# Patient Record
Sex: Female | Born: 1960
Health system: Southern US, Community
[De-identification: ages and names within clinical notes are randomized; demographics above are authoritative.]

## PROBLEM LIST (undated history)

## (undated) DIAGNOSIS — R232 Flushing: Secondary | ICD-10-CM

## (undated) DIAGNOSIS — R5383 Other fatigue: Secondary | ICD-10-CM

## (undated) DIAGNOSIS — R6882 Decreased libido: Secondary | ICD-10-CM

## (undated) DIAGNOSIS — F32A Depression, unspecified: Secondary | ICD-10-CM

## (undated) DIAGNOSIS — Z86010 Personal history of colonic polyps: Secondary | ICD-10-CM

## (undated) DIAGNOSIS — I10 Essential (primary) hypertension: Secondary | ICD-10-CM

## (undated) DIAGNOSIS — F329 Major depressive disorder, single episode, unspecified: Secondary | ICD-10-CM

## (undated) DIAGNOSIS — K760 Fatty (change of) liver, not elsewhere classified: Secondary | ICD-10-CM

## (undated) DIAGNOSIS — M549 Dorsalgia, unspecified: Secondary | ICD-10-CM

## (undated) DIAGNOSIS — R7303 Prediabetes: Secondary | ICD-10-CM

## (undated) DIAGNOSIS — R600 Localized edema: Secondary | ICD-10-CM

## (undated) DIAGNOSIS — N764 Abscess of vulva: Secondary | ICD-10-CM

## (undated) DIAGNOSIS — G4730 Sleep apnea, unspecified: Secondary | ICD-10-CM

## (undated) DIAGNOSIS — R079 Chest pain, unspecified: Secondary | ICD-10-CM

## (undated) DIAGNOSIS — R0600 Dyspnea, unspecified: Secondary | ICD-10-CM

## (undated) DIAGNOSIS — M2550 Pain in unspecified joint: Secondary | ICD-10-CM

## (undated) HISTORY — DX: Personal history of colonic polyps: Z86.010

## (undated) HISTORY — DX: Fatty (change of) liver, not elsewhere classified: K76.0

## (undated) HISTORY — PX: TONSILLECTOMY: SHX5217

## (undated) HISTORY — DX: Other fatigue: R53.83

## (undated) HISTORY — DX: Dorsalgia, unspecified: M54.9

## (undated) HISTORY — DX: Abscess of vulva: N76.4

## (undated) HISTORY — PX: WISDOM TOOTH EXTRACTION: SHX21

## (undated) HISTORY — DX: Depression, unspecified: F32.A

## (undated) HISTORY — DX: Sleep apnea, unspecified: G47.30

## (undated) HISTORY — DX: Prediabetes: R73.03

## (undated) HISTORY — DX: Decreased libido: R68.82

## (undated) HISTORY — DX: Pain in unspecified joint: M25.50

## (undated) HISTORY — DX: Localized edema: R60.0

## (undated) HISTORY — DX: Dyspnea, unspecified: R06.00

## (undated) HISTORY — DX: Essential (primary) hypertension: I10

## (undated) HISTORY — DX: Major depressive disorder, single episode, unspecified: F32.9

## (undated) HISTORY — DX: Flushing: R23.2

## (undated) HISTORY — DX: Chest pain, unspecified: R07.9

---

## 1998-08-29 ENCOUNTER — Other Ambulatory Visit: Admission: RE | Admit: 1998-08-29 | Discharge: 1998-08-29 | Payer: Self-pay | Admitting: Obstetrics and Gynecology

## 1998-09-14 ENCOUNTER — Encounter: Payer: Self-pay | Admitting: Obstetrics and Gynecology

## 1998-09-14 ENCOUNTER — Ambulatory Visit (HOSPITAL_COMMUNITY): Admission: RE | Admit: 1998-09-14 | Discharge: 1998-09-14 | Payer: Self-pay | Admitting: Obstetrics & Gynecology

## 2000-12-24 ENCOUNTER — Other Ambulatory Visit: Admission: RE | Admit: 2000-12-24 | Discharge: 2000-12-24 | Payer: Self-pay | Admitting: Obstetrics and Gynecology

## 2001-01-19 ENCOUNTER — Encounter: Payer: Self-pay | Admitting: Obstetrics and Gynecology

## 2001-01-19 ENCOUNTER — Ambulatory Visit (HOSPITAL_COMMUNITY): Admission: RE | Admit: 2001-01-19 | Discharge: 2001-01-19 | Payer: Self-pay | Admitting: Obstetrics and Gynecology

## 2001-12-23 ENCOUNTER — Other Ambulatory Visit: Admission: RE | Admit: 2001-12-23 | Discharge: 2001-12-23 | Payer: Self-pay | Admitting: Obstetrics and Gynecology

## 2002-02-09 ENCOUNTER — Encounter: Payer: Self-pay | Admitting: Obstetrics and Gynecology

## 2002-02-09 ENCOUNTER — Ambulatory Visit (HOSPITAL_COMMUNITY): Admission: RE | Admit: 2002-02-09 | Discharge: 2002-02-09 | Payer: Self-pay | Admitting: Obstetrics and Gynecology

## 2009-05-19 LAB — CONVERTED CEMR LAB: Pap Smear: NORMAL

## 2009-05-19 LAB — HM MAMMOGRAPHY: HM Mammogram: NORMAL

## 2009-05-25 ENCOUNTER — Encounter: Admission: RE | Admit: 2009-05-25 | Discharge: 2009-05-25 | Payer: Self-pay | Admitting: Obstetrics and Gynecology

## 2009-07-26 ENCOUNTER — Ambulatory Visit: Payer: Self-pay | Admitting: Internal Medicine

## 2009-07-26 DIAGNOSIS — R0602 Shortness of breath: Secondary | ICD-10-CM | POA: Insufficient documentation

## 2009-07-26 DIAGNOSIS — R062 Wheezing: Secondary | ICD-10-CM | POA: Insufficient documentation

## 2009-07-26 DIAGNOSIS — F32A Depression, unspecified: Secondary | ICD-10-CM | POA: Insufficient documentation

## 2009-07-26 DIAGNOSIS — F329 Major depressive disorder, single episode, unspecified: Secondary | ICD-10-CM

## 2009-07-26 DIAGNOSIS — Z87891 Personal history of nicotine dependence: Secondary | ICD-10-CM | POA: Insufficient documentation

## 2009-07-28 LAB — CONVERTED CEMR LAB
ALT: 28 units/L (ref 0–35)
AST: 29 units/L (ref 0–37)
Albumin: 4.7 g/dL (ref 3.5–5.2)
Alkaline Phosphatase: 55 units/L (ref 39–117)
BUN: 13 mg/dL (ref 6–23)
Basophils Absolute: 0.1 10*3/uL (ref 0.0–0.1)
Basophils Relative: 1 % (ref 0.0–3.0)
Bilirubin, Direct: 0.1 mg/dL (ref 0.0–0.3)
CO2: 30 meq/L (ref 19–32)
Calcium: 10 mg/dL (ref 8.4–10.5)
Chloride: 104 meq/L (ref 96–112)
Cholesterol: 251 mg/dL — ABNORMAL HIGH (ref 0–200)
Creatinine, Ser: 0.7 mg/dL (ref 0.4–1.2)
Direct LDL: 163.5 mg/dL
Eosinophils Absolute: 0.7 10*3/uL (ref 0.0–0.7)
Eosinophils Relative: 5.8 % — ABNORMAL HIGH (ref 0.0–5.0)
GFR calc non Af Amer: 100.7 mL/min (ref >60)
Glucose, Bld: 85 mg/dL (ref 70–99)
HCT: 42.5 % (ref 36.0–46.0)
HDL: 52.4 mg/dL (ref >39.00)
Hemoglobin: 14.7 g/dL (ref 12.0–15.0)
Lymphocytes Relative: 29 % (ref 12.0–46.0)
Lymphs Abs: 3.3 10*3/uL (ref 0.7–4.0)
MCHC: 34.6 g/dL (ref 30.0–36.0)
MCV: 88.1 fL (ref 78.0–100.0)
Monocytes Absolute: 0.8 10*3/uL (ref 0.1–1.0)
Monocytes Relative: 7.4 % (ref 3.0–12.0)
Neutro Abs: 6.5 10*3/uL (ref 1.4–7.7)
Neutrophils Relative %: 56.8 % (ref 43.0–77.0)
Platelets: 324 10*3/uL (ref 150.0–400.0)
Potassium: 5.2 meq/L — ABNORMAL HIGH (ref 3.5–5.1)
RBC: 4.82 M/uL (ref 3.87–5.11)
RDW: 12.5 % (ref 11.5–14.6)
Sodium: 145 meq/L (ref 135–145)
TSH: 1.3 microintl units/mL (ref 0.35–5.50)
Total Bilirubin: 0.8 mg/dL (ref 0.3–1.2)
Total CHOL/HDL Ratio: 5
Total Protein: 7.7 g/dL (ref 6.0–8.3)
Triglycerides: 206 mg/dL — ABNORMAL HIGH (ref 0.0–149.0)
VLDL: 41.2 mg/dL — ABNORMAL HIGH (ref 0.0–40.0)
WBC: 11.4 10*3/uL — ABNORMAL HIGH (ref 4.5–10.5)

## 2009-08-10 ENCOUNTER — Telehealth: Payer: Self-pay | Admitting: *Deleted

## 2009-08-24 ENCOUNTER — Telehealth (INDEPENDENT_AMBULATORY_CARE_PROVIDER_SITE_OTHER): Payer: Self-pay | Admitting: *Deleted

## 2009-08-30 ENCOUNTER — Ambulatory Visit: Payer: Self-pay | Admitting: Internal Medicine

## 2009-08-31 ENCOUNTER — Telehealth: Payer: Self-pay | Admitting: *Deleted

## 2009-08-31 ENCOUNTER — Encounter: Payer: Self-pay | Admitting: Internal Medicine

## 2009-10-18 ENCOUNTER — Ambulatory Visit: Payer: Self-pay | Admitting: Internal Medicine

## 2009-10-18 DIAGNOSIS — N951 Menopausal and female climacteric states: Secondary | ICD-10-CM | POA: Insufficient documentation

## 2009-10-26 ENCOUNTER — Encounter (INDEPENDENT_AMBULATORY_CARE_PROVIDER_SITE_OTHER): Payer: Self-pay | Admitting: *Deleted

## 2009-11-10 ENCOUNTER — Telehealth: Payer: Self-pay | Admitting: Internal Medicine

## 2009-11-30 ENCOUNTER — Ambulatory Visit: Payer: Self-pay | Admitting: Internal Medicine

## 2009-12-07 ENCOUNTER — Encounter: Payer: Self-pay | Admitting: Internal Medicine

## 2010-02-22 ENCOUNTER — Telehealth: Payer: Self-pay | Admitting: *Deleted

## 2010-03-20 NOTE — Assessment & Plan Note (Signed)
Summary: fup pft//ccm   Vital Signs:  Patient profile:   50 year old female Menstrual status:  regular LMP:     10/04/2009 Weight:      192 pounds O2 Sat:      98 % on Room air Pulse rate:   83 / minute BP sitting:   110 / 70  (left arm) Cuff size:   regular  Vitals Entered By: Romualdo Bolk, CMA (AAMA) (October 18, 2009 11:44 AM)  O2 Flow:  Room air  Contraindications/Deferment of Procedures/Staging:    Test/Procedure: FLU VAX    Reason for deferment: patient declined  CC: Follow-up visit on PFT's and discuss going on antidpressant-Pt states that she feels very high strong at work and has trouble controlling her temperment., Depression LMP (date): 10/04/2009 LMP - Character: normal Menarche (age onset years): 12   Menses interval (days): 28 Menstrual flow (days): 5-7 Enter LMP: 10/04/2009 Last PAP Result normal   History of Present Illness: Desiree Cooper  comes in today  for follow up of   medical problems.  RESP: much bettter on the symbicort  Had prfts .Marland Kitchen ? need for follow up  Prevention: hasnt t gone to  colonosocopy yet/ Mood issues:  externally  sressed and hard to concentrate and now she thinks irs  affecting her work.      off an on for about a year. and worse for 3-4 months . NO hx of depression  rx or strong family hx  of such. No add or ld dx. lots of energy to  get throught her work day.    anxious and sad with this.   and sometimes short with people.  Not suicidal.   She is menopausal and some of above felt tot be aggravating above .  Depression History:      The patient denies a depressed mood most of the day but notes a diminished interest in her usual daily activities.  Positive alarm features for depression include significant weight gain, insomnia, fatigue (loss of energy), and impaired concentration (indecisiveness).  However, she denies significant weight loss, hypersomnia, psychomotor agitation, psychomotor retardation, feelings of worthlessness  (guilt), and recurrent thoughts of death or suicide.  Positive alarm features for a manic disorder include persistently & abnormally elevated mood, abnormally & persistently irritable mood, distractibility, and flight of ideas.  She denies less need for sleep, talkative or feels need to keep talking, increase in goal-directed activity, psychomotor agitation, inflated self-esteem or grandiosity, excessive buying sprees, excessive sexual indiscretions, and excessive foolish business investments.        Risk factors for depression include a family history of depression, a family history of alcoholism, a personal history of depression, and a personal history of alcoholism.  The patient denies that she feels like life is not worth living, denies that she wishes that she were dead, and denies that she has thought about ending her life.  Due to her current symptoms, it often takes extra effort to do the things she needs to do.         Preventive Screening-Counseling & Management  Alcohol-Tobacco     Alcohol drinks/day: <1     Alcohol type: wine     Smoking Status: quit     Year Quit: 2005     Passive Smoke Exposure: no  Caffeine-Diet-Exercise     Caffeine use/day: 3     Does Patient Exercise: yes  Current Medications (verified): 1)  Ventolin Hfa 108 (90 Base) Mcg/act Aers (Albuterol  Sulfate) 2)  Natural Herbs- Premenopausal 3)  Multivitamins   Tabs (Multiple Vitamin) 4)  Fish Oil   Oil (Fish Oil) 5)  Symbicort 160-4.5 Mcg/act Aero (Budesonide-Formoterol Fumarate) .... 2 Puffs Two Times A Day  Allergies (verified): 1)  ! Penicillin  Past History:  Past medical, surgical, family and social histories (including risk factors) reviewed, and no changes noted (except as noted below).  Past Medical History: Reviewed history from 07/26/2009 and no changes required. G1P1   1988 ? asthma   inhaler use for 4 years  Depression Joint pains   Past Surgical History: Reviewed history from 07/26/2009  and no changes required. Tonsillectomy  Past History:  Care Management: Gynecology: Stefano Gaul  Family History: Reviewed history from 07/26/2009 and no changes required. Father: Deceased age 48-Alcholism Mother: COPD, arthritis     depression Siblings: Only Child no hx of clotting   Social History: Reviewed history from 07/26/2009 and no changes required. 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  The patient denies anorexia, fever, weight loss, vision loss, prolonged cough, muscle weakness, difficulty walking, abnormal bleeding, enlarged lymph nodes, and angioedema.    Physical Exam  General:  alert, well-developed, well-nourished, and well-hydrated.   Head:  normocephalic and atraumatic.   Neck:  No deformities, masses, or tenderness noted. Lungs:  normal respiratory effort, no intercostal retractions, and no accessory muscle use.  see pFTS reviewed  Heart:  normal rate, regular rhythm, no murmur, and no gallop.   Psych:  Oriented X3, good eye contact, and not anxious appearing.  appears stressesd and somehat subdued .   nl cognition and speech  for situations   Impression & Recommendations:  Problem # 1:  DYSPNEA (ICD-786.05) Assessment Improved nl pfts but felet better witn inhalers .    still rec  eval   opnion   for future managment and assessment of signs .  she agress   Problem # 2:  MENOPAUSE-RELATED VASOMOTOR SYMPTOMS, HOT FLASHES (ICD-627.2) problematic  affecting mood and concentration  Problem # 3:  OBESITY (ICD-278.00) agree with weight loss Ht: 63 (07/26/2009)   Wt: 192 (10/18/2009)   BMI: 33.25 (07/26/2009)  Problem # 4:  DEPRESSION (ICD-311) multiple factors and agree meds may help with other  strategies .  Discussed risk benefit     optinos  discussed  Her updated medication list for this problem includes:    Fluoxetine Hcl 20 Mg Tabs (Fluoxetine hcl) .Marland Kitchen... Take 1/2 by mouth once daily and then increase to 1 by mouth once daily after a week  Complete Medication List: 1)  Ventolin Hfa 108 (90 Base) Mcg/act Aers (Albuterol sulfate) 2)  Natural Herbs- Premenopausal  3)  Multivitamins Tabs (Multiple vitamin) 4)  Fish Oil Oil (Fish oil) 5)  Symbicort 160-4.5 Mcg/act Aero (Budesonide-formoterol fumarate) .... 2 puffs two times a day 6)  Prometrium 100 Mg Caps (Progesterone micronized) .... ? once a night    per gyne 7)  Fluoxetine Hcl 20 Mg Tabs (Fluoxetine hcl) .... Take 1/2 by mouth once daily and then increase to 1 by mouth once daily after a week  Patient Instructions: 1)  take med as directed   2)  ROV   in 3-4 weeks  call as  needed  3)  keep pulmonary appt for reasons discussed  Prescriptions: FLUOXETINE HCL 20 MG TABS (FLUOXETINE HCL) take 1/2 by mouth once daily and then increase to 1 by mouth once daily after a week  #30 x 1   Entered and Authorized by:   Madelin Headings MD   Signed by:   Madelin Headings MD on 10/18/2009   Method used:   Print then Give to Patient   RxID:   [id number] FLUOXETINE HCL 20 MG TABS (FLUOXETINE HCL) take 1/2 by mouth once daily and then increase to 1 by mouth once daily after a week  #30 x 1   Entered and Authorized by:   Madelin Headings MD   Signed by:   Madelin Headings MD on 10/18/2009   Method used:   [facility] to        [facility] [phone][facility] (retail)       [address]       [place], [place]  [address]       Ph: [phone] or [phone]       Fax: [phone]   RxID:   [id number]

## 2010-03-20 NOTE — Progress Notes (Signed)
Summary: PROCEDURE CODES  Phone Note From Other Clinic   Caller: susan w/ [employer] Call For: triage Summary of Call: needs procedure codes for the PFT scheduled for pt next week (7/13). note:looks like dr Fabian Sharp ordered this. susan at [facility] # [phone] Initial call taken by: Tivis Ringer, CNA,  August 24, 2009 3:57 PM  Follow-up for Phone Call        This test was ordered by Dr. Fabian Sharp.  This is not our pt.  I left VM for Darl Pikes to be aware of this and advised that she contact Dr. Fabian Sharp for this.  Follow-up by: Vernie Murders,  August 24, 2009 4:07 PM

## 2010-03-20 NOTE — Letter (Signed)
Summary: PATIENT HX FORMS  PATIENT HX FORMS   Imported By: Georgian Co 12/07/2009 15:26:05  _____________________________________________________________________  External Attachment:    Type:   Image     Comment:   External Document

## 2010-03-20 NOTE — Progress Notes (Signed)
Summary: refill on symbicort to [facility] Note From Pharmacy   Caller: [facility] Reason for Call: Needs renewal Details for Reason: Symbicort Summary of Call: Pt has never had any rx's sent to [facility] before. Before we can send this to [facility] we need to verify that pt wants this rx sent to [facility]. I have left pt a message to call us back to let us know that she does want [place] to send this rx to [facility]. Initial call taken by: Romualdo Bolk, CMA (AAMA),  August 31, 2009 11:30 AM  Follow-up for Phone Call        Pt called back saying that she does want rx sent to [facility]. Rx sent to [facility] Follow-up by: Romualdo Bolk, CMA (AAMA),  August 31, 2009 12:00 PM    Prescriptions: SYMBICORT 160-4.5 MCG/ACT AERO (BUDESONIDE-FORMOTEROL FUMARATE) 2 puffs two times a day  #3 x 3   Entered by:   Romualdo Bolk, CMA (AAMA)   Authorized by:   Madelin Headings MD   Signed by:   Romualdo Bolk, CMA (AAMA) on 08/31/2009   Method used:   Electronically to        [facility]* (retail)             ,          Ph: [phone]       Fax: [phone]   RxID:   [id number]

## 2010-03-20 NOTE — Assessment & Plan Note (Signed)
Summary: BRAND NEW PT/TO EST/CJR   Vital Signs:  Patient profile:   50 year old female Menstrual status:  regular LMP:     07/04/2009 Height:      63 inches Weight:      187 pounds BMI:     33.25 Pulse rate:   72 / minute BP sitting:   120 / 80  (left arm) Cuff size:   regular  Vitals Entered By: Romualdo Bolk, CMA (AAMA) (July 26, 2009 2:01 PM)  Nutrition Counseling: Patient's BMI is greater than 25 and therefore counseled on weight management options. CC: New Pt to establish- Discuss breathing problems Asthma/ Bronchitis LMP (date): 07/04/2009 LMP - Character: normal Menarche (age onset years): 12   Menses interval (days): 28 Menstrual flow (days): 5-7 Menstrual Status regular Enter LMP: 07/04/2009 Last PAP Result normal   History of Present Illness: Kalima Dill comes in today   for new patient visit.  No PCP in the area and had moved   from [facility].  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  Major problem has been respiratory.   seems from moving to [place] area . ( over a year )   Using inhaler q d . and pre exercise.   Worsening  [place]    IF  lays on right side wheezes in her upper chest .   Last chest x ray  was ok   4 years ago.  has been seen in Urgent care several times for resp  problems in the past and   treated .        no hx of seasonal allergies   .  Remote hx of tobacco stopped 5 years ago and  chronic cough that she had  went away.    No ets  or other aggravators seen.   Preventive Care Screening  Mammogram:    Date:  05/19/2009    Results:  normal   Pap Smear:    Date:  05/19/2009    Results:  normal    Preventive Screening-Counseling & Management  Alcohol-Tobacco     Alcohol drinks/day: <1     Alcohol type: wine     Smoking Status: quit     Year Quit: 2005     Passive Smoke Exposure: no  Caffeine-Diet-Exercise     Caffeine use/day: 3     Does Patient  Exercise: yes  Comments: 1/2 ppd   for 20 years ,     no ets.   Safety-Violence-Falls     Seat Belt Use: yes     Firearms in the Home: no firearms in the home     Smoke Detectors: yes      Drug Use:  no.    Current Medications (verified): 1)  Ventolin Hfa 108 (90 Base) Mcg/act Aers (Albuterol Sulfate) 2)  Natural Herbs- Premenopausal 3)  Multivitamins   Tabs (Multiple Vitamin) 4)  Fish Oil   Oil (Fish Oil)  Allergies (verified): 1)  ! Penicillin  Past History:  Past Medical History: G1P1   1988 ? 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         had lost weight and then gained back   weigh tloss helped breathing . snores  ? no OSA  Physical Exam  General:  alert, well-developed, well-nourished, and well-hydrated.    does no cough but some throat clearing during exam  Head:  normocephalic and atraumatic.   Eyes:  PERRL, EOMs full, conjunctiva clear  Ears:  R ear normal, L ear normal, and no external deformities.   Nose:  no external deformity, no external erythema, and no nasal discharge.  face on  tender Mouth:  pharynx pink and moist.  low sort palate Neck:  No deformities, masses, or tenderness noted. Lungs:  Normal respiratory effort, chest expands symmetrically. Lungs are clear to auscultation, no crackles or wheezes.no dullness.   Heart:  Normal rate and regular rhythm. S1 and S2 normal without gallop, murmur, click, rub or other extra sounds.no lifts.   Abdomen:  Bowel sounds positive,abdomen soft and non-tender without masses, organomegaly or   noted. Msk:  no joint swelling, no joint warmth, and no redness over joints.   Pulses:  pulses intact without delay   Extremities:  no clubbing cyanosis or edema  Neurologic:  alert & oriented X3, strength normal in all extremities, and gait normal.   Skin:  turgor normal, color normal, no ecchymoses, and no petechiae.   Cervical Nodes:  No lymphadenopathy noted Psych:  Oriented X3, normally interactive, good eye contact, not anxious appearing, and not depressed appearing.     Impression & Recommendations:  Problem # 1:  DYSPNEA (ICD-786.05) see below   doubt cardiac cause   hx of tobacco and has obesity  and family hx of copd.   should evaluate.  Orders: T-2 View CXR (71020TC) TLB-TSH (Thyroid Stimulating Hormone) (84443-TSH) TLB-Hepatic/Liver Function Pnl (80076-HEPATIC) TLB-CBC Platelet - w/Differential (85025-CBCD) TLB-BMP (Basic Metabolic Panel-BMET) (80048-METABOL) TLB-Lipid Panel (80061-LIPID) Pulmonary Referral (Pulmonary)  Problem # 2:  WHEEZING (ICD-786.07) ? if asthmatic  related      could have some upper airway component.  better with weight reduction and has clearing of throat  vs cough on observation Orders: T-2 View CXR (71020TC) TLB-TSH (Thyroid Stimulating Hormone) (84443-TSH) TLB-Hepatic/Liver Function Pnl (80076-HEPATIC) TLB-CBC Platelet - w/Differential (85025-CBCD) TLB-BMP (Basic Metabolic Panel-BMET) (80048-METABOL) TLB-Lipid Panel (80061-LIPID) Pulmonary Referral (Pulmonary)  Problem # 3:  OBESITY  (ICD-278.00) agree with weight loss again  Problem # 4:  TOBACCO USE, QUIT (ICD-V15.82) Assessment: Comment Only  Complete Medication List: 1)  Ventolin Hfa 108 (90 Base) Mcg/act Aers (Albuterol sulfate) 2)  Natural Herbs- Premenopausal  3)  Multivitamins Tabs (Multiple vitamin) 4)  Fish Oil Oil (Fish oil) 5)  Symbicort 160-4.5 Mcg/act Aero (Budesonide-formoterol fumarate) .... 2 puffs two times a day  Other Orders: Gastroenterology Referral (GI)  Patient Instructions: 1)  will contact your about colonoscopy screening . 2)  get chest x ray  3)  labs today . 4)  Will contact you about PFTs to be done.  5)  Can try inhaler in the  meantime .  6)  May need to see pulmonary.  7)  Then ROV.  to go over all results    Immunization History:  Pneumovax Immunization History:    Pneumovax:  historical (02/19/2007)

## 2010-03-20 NOTE — Progress Notes (Signed)
Summary: scheduling a colonscopy  Phone Note Outgoing Call Call back at [facility] Phone [phone]   Call placed by: Romualdo Bolk, CMA Duncan Dull),  November 10, 2009 3:07 PM Call placed to: Patient Summary of Call: Calling pt about scheduling a colonscopy. Left message to call back. Initial call taken by: Romualdo Bolk, CMA Duncan Dull),  November 10, 2009 3:07 PM  Follow-up for Phone Call        Left message to call back. Follow-up by: Romualdo Bolk, CMA Duncan Dull),  November 13, 2009 4:43 PM  Additional Follow-up for Phone Call Additional follow up Details #1::        LMTOCB Additional Follow-up by: Romualdo Bolk, CMA Duncan Dull),  November 14, 2009 1:27 PM    Additional Follow-up for Phone Call Additional follow up Details #2::    Spoke to pt she wants to hold off on scheduling the colonscopy for now. Follow-up by: Romualdo Bolk, CMA (AAMA),  November 17, 2009 2:41 PM

## 2010-03-20 NOTE — Progress Notes (Signed)
Summary: Pt req script for Symbicort  Phone Note Refill Request Call back at Home Phone [phone] Message from:  Patient on August 10, 2009 1:14 PM  Refills Requested: Medication #1:  SYMBICORT 160-4.5 MCG/ACT AERO 2 puffs two times a day. Pt was given samples of Symbicort. Req script.    Method Requested: Telephone to [facility] [phone] Initial call taken by: Lucy Antigua,  August 10, 2009 1:14 PM  Follow-up for Phone Call        please refill x 6  Follow-up by: Madelin Headings MD,  August 11, 2009 12:15 PM  Additional Follow-up for Phone Call Additional follow up Details #1::        Rx sent to pharmacy. Additional Follow-up by: Romualdo Bolk, CMA Duncan Dull),  August 11, 2009 12:46 PM    Prescriptions: SYMBICORT 160-4.5 MCG/ACT AERO (BUDESONIDE-FORMOTEROL FUMARATE) 2 puffs two times a day  #1 x 5   Entered by:   Romualdo Bolk, CMA (AAMA)   Authorized by:   Madelin Headings MD   Signed by:   Romualdo Bolk, CMA (AAMA) on 08/11/2009   Method used:   [facility]        [facility] [phone]* (retail)       [address]       [place], [place]  [address]       Ph: [phone] or [phone]       Fax: [phone]   RxID:   [id number]

## 2010-03-20 NOTE — Miscellaneous (Signed)
Summary: Orders Update pft charges  Clinical Lists Changes  Orders: Added new Service order of Carbon Monoxide diffusing w/capacity (94720) - Signed Added new Service order of Lung Volumes (94240) - Signed Added new Service order of Spirometry (Pre & Post) (94060) - Signed 

## 2010-03-20 NOTE — Assessment & Plan Note (Signed)
Summary: follow up on meds/ssc   Vital Signs:  Patient profile:   50 year old female Menstrual status:  regular LMP:     11/04/2009 Weight:      190 pounds Pulse rate:   72 / minute BP sitting:   120 / 80  (right arm) Cuff size:   regular  Vitals Entered By: Romualdo Bolk, CMA Duncan Dull) (November 30, 2009 9:43 AM) CC: Follow-up visit on meds LMP (date): 11/04/2009 LMP - Character: normal Menarche (age onset years): 12   Menses interval (days): 28 Menstrual flow (days): 5-7 Enter LMP: 11/04/2009 Last PAP Result normal   History of Present Illness: Desiree Cooper  come in today for follow up of medication  initiation for depression . She is now on 20 mg prozac and she feels the  med helping mood as far being    more lay back  Others noted at work also.  No se     noted .  Sleep varies.      nose of med   Breathing still ok and using albuterol  weekly.   less and less.  hasnt seen pulmonary follow up yet.   Has high deductable plan and some issues with this.   Preventive Screening-Counseling & Management  Alcohol-Tobacco     Alcohol drinks/day: <1     Alcohol type: wine     Smoking Status: quit     Year Quit: 2005     Passive Smoke Exposure: no  Caffeine-Diet-Exercise     Caffeine use/day: 3     Does Patient Exercise: yes  Current Medications (verified): 1)  Natural Herbs- Premenopausal 2)  Multivitamins   Tabs (Multiple Vitamin) 3)  Fish Oil   Oil (Fish Oil) 4)  Symbicort 160-4.5 Mcg/act Aero (Budesonide-Formoterol Fumarate) .... 2 Puffs Two Times A Day 5)  Prometrium 100 Mg Caps (Progesterone Micronized) .... ? Once A Night    Per Gyne 6)  Fluoxetine Hcl 20 Mg Tabs (Fluoxetine Hcl) .Marland Kitchen.. 1 By Mouth Once Daily  Allergies (verified): 1)  ! Penicillin  Past History:  Past medical, surgical, family and social histories (including risk factors) reviewed for relevance to current acute and chronic problems.  Past Medical History: Reviewed history from 07/26/2009 and  no changes required. G1P1   1988 ? asthma   inhaler use for 4 years  Depression Joint pains   Past Surgical History: Reviewed history from 07/26/2009 and no changes required. Tonsillectomy  Past History:  Care Management: Gynecology: Stefano Gaul  Contraindications/Deferment of Procedures/Staging:    Test/Procedure: FLU VAX    Reason for deferment: patient declined   Family History: Reviewed history from 10/18/2009 and no changes required. Father: Deceased age 72-Alcholism Mother: COPD, arthritis     depression Siblings: Only Child no hx of clotting   Social History: Reviewed history from 10/18/2009 and no changes required. 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    Physical Exam  General:  Well-developed,well-nourished,in no acute distress; alert,appropriate and cooperative throughout examination Head:  Normocephalic and atraumatic without obvious abnormalities. No apparent alopecia or balding. Eyes:  vision grossly intact and pupils equal.   Neck:  No deformities, masses, or tenderness noted. Lungs:  Normal respiratory effort, chest expands symmetrically. Lungs are clear  to auscultation, no crackles or wheezes. Heart:  normal rate, regular rhythm, and no murmur.   Psych:  Oriented X3, normally interactive, good eye contact, not anxious appearing, and not depressed appearing.  some increase motor activity   Impression & Recommendations:  Problem # 1:  DEPRESSION (ICD-311) Assessment Improved continue same  consider increase dose if needed   call for this    has high deductable plan.  situation is stable enough to follow over the phone and in person in six months. Reviewed links of medication management. Her updated medication list for this problem  includes:    Fluoxetine Hcl 20 Mg Tabs (Fluoxetine hcl) .Marland Kitchen... 1 by mouth once daily  Problem # 2:  DYSPNEA (ICD-786.05) Assessment: Improved will get to pulm again soon but doing better   treating as asthma   Problem # 3:  SCREENING, COLON CANCER (ICD-V76.51) she hasnt follow up yet with getting her colonoscopy but she will.  Complete Medication List: 1)  Natural Herbs- Premenopausal  2)  Multivitamins Tabs (Multiple vitamin) 3)  Fish Oil Oil (Fish oil) 4)  Symbicort 160-4.5 Mcg/act Aero (Budesonide-formoterol fumarate) .... 2 puffs two times a day 5)  Prometrium 100 Mg Caps (Progesterone micronized) .... ? once a night    per gyne 6)  Fluoxetine Hcl 20 Mg Tabs (Fluoxetine hcl) .Marland Kitchen.. 1 by mouth once daily  Patient Instructions: 1)  continue same   medication. 2)  call in 2 months  about progress.  3)  then plan  follow up . 4)  but ov or check up in 6 months  Prescriptions: FLUOXETINE HCL 20 MG TABS (FLUOXETINE HCL) 1 by mouth once daily  #90 x 1   Entered and Authorized by:   Madelin Headings MD   Signed by:   Madelin Headings MD on 11/30/2009   Method used:   Print then Give to Patient   RxID:   [id number]

## 2010-03-20 NOTE — Letter (Signed)
Summary: LEC Referral (unable to schedule) Notification  [facility]  [address] [place], [place] [address]   Phone: [phone]  Fax: [phone]      October 26, 2009 Desiree Cooper [birth year] MRN: [id number]   [name] [address] [place], [place]  [address]   Dear [facility]:   Thank you for your kind referral of the above patient. We have attempted to schedule the recommended COLONOSCOPY but have been unable to schedule because:  _X_ The patient was not available by phone and/or has not returned our calls.  __ The patient declined to schedule the procedure at this time.  We appreciate the referral and hope that we will have the opportunity to treat this patient in the future.    Sincerely,   [facility]  Vania Rea. Jarold Motto M.D. Hedwig Morton. Juanda Chance M.D. Venita Lick. Russella Dar M.D. Wilhemina Bonito. Marina Goodell M.D. Barbette Hair. Arlyce Dice M.D. Iva Boop M.D. Cheron Every.D.

## 2010-03-22 NOTE — Progress Notes (Signed)
Summary: Pt req refill of FLUOXETINE HCL 20 MG to [facility] mail order  Phone Note Refill Request Call back at Home Phone [phone] Message from:  Patient on February 22, 2010 4:44 PM  Refills Requested: Medication #1:  FLUOXETINE HCL 20 MG TABS 1 by mouth once daily.   Dosage confirmed as above?Dosage Confirmed   Supply Requested: 3 months Pt is req that this be sent to [facility] order pharmacy. Pls call [facility] [phone]   Method Requested: Telephone to [facility] [phone] Initial call taken by: Lucy Antigua,  February 22, 2010 4:46 PM    Prescriptions: FLUOXETINE HCL 20 MG TABS (FLUOXETINE HCL) 1 by mouth once daily  #90 x 1   Entered by:   Romualdo Bolk, CMA (AAMA)   Authorized by:   Madelin Headings MD   Signed by:   Romualdo Bolk, CMA (AAMA) on 02/23/2010   Method used:   Electronically to        [facility]* (retail)             ,          Ph: [phone]       Fax: [phone]   RxID:   [id number]

## 2010-05-31 ENCOUNTER — Encounter: Payer: Self-pay | Admitting: Internal Medicine

## 2010-06-04 ENCOUNTER — Ambulatory Visit (INDEPENDENT_AMBULATORY_CARE_PROVIDER_SITE_OTHER): Payer: BLUE CROSS/BLUE SHIELD | Admitting: Internal Medicine

## 2010-06-04 ENCOUNTER — Encounter: Payer: Self-pay | Admitting: Internal Medicine

## 2010-06-04 VITALS — BP 120/80 | HR 66 | Wt 197.0 lb

## 2010-06-04 DIAGNOSIS — M79603 Pain in arm, unspecified: Secondary | ICD-10-CM | POA: Insufficient documentation

## 2010-06-04 DIAGNOSIS — M79609 Pain in unspecified limb: Secondary | ICD-10-CM

## 2010-06-04 DIAGNOSIS — R209 Unspecified disturbances of skin sensation: Secondary | ICD-10-CM

## 2010-06-04 DIAGNOSIS — M542 Cervicalgia: Secondary | ICD-10-CM | POA: Insufficient documentation

## 2010-06-04 DIAGNOSIS — R200 Anesthesia of skin: Secondary | ICD-10-CM | POA: Insufficient documentation

## 2010-06-04 MED ORDER — NAPROXEN 500 MG PO TABS: 500.0000 mg | ORAL_TABLET | Freq: Two times a day (BID) | ORAL | Status: DC

## 2010-06-04 NOTE — Progress Notes (Signed)
  Subjective:    Patient ID: Desiree Cooper, female    DOB: 01-25-1961, 50 y.o.   MRN: [id number]  HPI Patient comes in for the above problem. For the last 6 months or so she's had some pain in her neck and some stiffness and decreased mobility. No specific injury. Then over the last 2 weeks she's had significant pain like a toothache in her right upper arm and intermittent numbness of her right hand. When she changes positions it sometimes goes away this is waxing and waning. It is worse when she lays on her stomach like she usually sleeps sometimes when she turns her neck.  She tried an Advil PM a left couple days without significant help she did take her friend's naproxen and seemed to help some of the pain. There is no Cp sob  weakness in her legs. No fever or weight loss. Remote history of degenerative disc disease in her spine has had an MRI of her lower spine when she was in [place].  Review of Systems As per hpi   . NO rash  HA or vision change  Past Medical History  Diagnosis Date  . Asthma     inhaler use for 4 years  . Depression   . Joint pain    Past Surgical History  Procedure Date  . Tonsillectomy     reports that she has quit smoking. She does not have any smokeless tobacco history on file. She reports that she drinks alcohol. She reports that she does not use illicit drugs. family history includes Alcohol abuse in her father; Arthritis in her mother; COPD in her mother; and Depression in her mother. Allergies  Allergen Reactions  . Penicillins        Objective:   Physical Exam WDWN in nad  Concerned about neck and arm HEENT: Normocephalic ;atraumatic , Eyes;  PERRL, EOMs  Full, lids and conjunctiva clear,,Ears: no deformities, canals nl, TM landmarks normal, Nose: no deformity or discharge  Mouth : OP clear without lesion or edema . Neck some mid spine tenderness   Dec rom rotation. Chest cta Cv pulses ue = rr .   Extr : Normal range of motion of shoulder arm  and hand. She describes intermittent pins and needles on her hand under certain positions.   Neuro:   DTR's are equal and symmetrical no clonus or obvious weakness. Gait is within normal limits Oriented x 3 and no noted deficits in memory, attention, and speech.        Assessment & Plan:  Right arm pain and numbness Pre-dating neck pain and stiffness.  This sounds like a radicular syndrome with significant pain most recently. The feeling of weakness. She states that she does have degenerative disc disease in her back but no lower extremity problems. Because of the severity and progression of her symptoms we will get an cervical spine MRI. In the meantime we discussed anti-inflammatory treatment and since she has been on little we will do naproxen twice a day if not improving consider prednisone.  We may need to referral depending on her status and her scan. She should call us in the meantime if things are worse or progressive.

## 2010-06-04 NOTE — Patient Instructions (Addendum)
I think this could be a pinched nerve related to your neck. We should take an anti-inflammatory dosage for the next 2 weeks and we'll order an MRI of your neck in the meantime. Call if your pain or numbness is getting worse in the meantime. Follow up depending on results of scan and how you ar doing.

## 2010-06-06 ENCOUNTER — Ambulatory Visit
Admission: RE | Admit: 2010-06-06 | Discharge: 2010-06-06 | Disposition: A | Discharge status: Home / Self Care | Payer: BLUE CROSS/BLUE SHIELD | Source: Ambulatory Visit | Attending: Internal Medicine | Admitting: Internal Medicine

## 2010-06-06 DIAGNOSIS — R200 Anesthesia of skin: Secondary | ICD-10-CM

## 2010-06-06 DIAGNOSIS — M79603 Pain in arm, unspecified: Secondary | ICD-10-CM

## 2010-06-07 ENCOUNTER — Telehealth: Payer: Self-pay | Admitting: *Deleted

## 2010-06-07 DIAGNOSIS — M542 Cervicalgia: Secondary | ICD-10-CM

## 2010-06-07 DIAGNOSIS — M79603 Pain in arm, unspecified: Secondary | ICD-10-CM

## 2010-06-07 DIAGNOSIS — R200 Anesthesia of skin: Secondary | ICD-10-CM

## 2010-06-07 NOTE — Telephone Encounter (Deleted)
Message copied by Jadden Yim on Thu Jun 07, 2010  1:49 PM ------      Message from: PANOSH, WANDA K      Created: Thu Jun 07, 2010 12:30 PM       Advise patient she has arthritis in her neck and some bulging discs. Although it appears to be worse on the left than the right. This is still probably a pinched nerve.   Would take the anti-inflammatory as we stated. We can get a consult from neurosurgery about management and other therapy. 

## 2010-06-07 NOTE — Telephone Encounter (Signed)
Message copied by Tor Netters on Thu Jun 07, 2010  1:49 PM ------      Message from: [facility], [place] K      Created: Thu Jun 07, 2010 12:30 PM       Advise patient she has arthritis in her neck and some bulging discs. Although it appears to be worse on the left than the right. This is still probably a pinched nerve.   Would take the anti-inflammatory as we stated. We can get a consult from neurosurgery about management and other therapy.

## 2010-06-07 NOTE — Telephone Encounter (Signed)
Pt aware of results. Order sent to [facility].

## 2010-06-07 NOTE — Telephone Encounter (Signed)
Left message for pt to call back  °

## 2010-10-31 ENCOUNTER — Telehealth: Payer: Self-pay | Admitting: *Deleted

## 2010-10-31 MED ORDER — FLUOXETINE HCL 20 MG PO TABS: ORAL_TABLET | ORAL | Status: DC

## 2010-10-31 NOTE — Telephone Encounter (Signed)
Refill on prozac

## 2011-02-18 ENCOUNTER — Telehealth: Payer: Self-pay | Admitting: Internal Medicine

## 2011-02-18 ENCOUNTER — Encounter: Payer: Self-pay | Admitting: Internal Medicine

## 2011-02-18 ENCOUNTER — Ambulatory Visit (INDEPENDENT_AMBULATORY_CARE_PROVIDER_SITE_OTHER): Payer: BLUE CROSS/BLUE SHIELD | Admitting: Internal Medicine

## 2011-02-18 VITALS — BP 120/76 | HR 83 | Temp 98.1°F | Wt 200.0 lb

## 2011-02-18 DIAGNOSIS — F329 Major depressive disorder, single episode, unspecified: Secondary | ICD-10-CM

## 2011-02-18 DIAGNOSIS — M542 Cervicalgia: Secondary | ICD-10-CM

## 2011-02-18 DIAGNOSIS — R062 Wheezing: Secondary | ICD-10-CM

## 2011-02-18 DIAGNOSIS — N951 Menopausal and female climacteric states: Secondary | ICD-10-CM

## 2011-02-18 DIAGNOSIS — F4323 Adjustment disorder with mixed anxiety and depressed mood: Secondary | ICD-10-CM

## 2011-02-18 MED ORDER — BUDESONIDE-FORMOTEROL FUMARATE 160-4.5 MCG/ACT IN AERO: 2.0000 | INHALATION_SPRAY | Freq: Two times a day (BID) | RESPIRATORY_TRACT | Status: DC

## 2011-02-18 MED ORDER — FLUOXETINE HCL 20 MG PO TABS: 30.0000 mg | ORAL_TABLET | Freq: Every day | ORAL | Status: DC

## 2011-02-18 NOTE — Patient Instructions (Addendum)
Increase  Fluoxetine to 30 mg per day for a few weeks and then can try 40 mg per day.  Use inhaler for prevention with triggers and in season.  You have degenerative disease in your neck. The neck pain is aggravated by postural issues and poss stress. Ok to do naproxyn type meds for this and look at your positional . May need to have  specialist  Reevaluate if continuing without improvement or arm symptoms recur.  Call when  Want [place] to do the colonoscopy. As you are due.   cpx with labs in 3-4 months or as needed

## 2011-02-18 NOTE — Progress Notes (Signed)
Subjective:    Patient ID: Desiree Cooper, female    DOB: 03-04-1960, 50 y.o.   MRN: [id number]  HPI Patient comes in today for follow up of  multiple medical problems.   Since last visit no major change in health.  Arm sx are better did have mri and saw specialist but not satisfied with opinion for now doing better still stiff esp at work but  Feels stable and no weakness.  Is right handed Some pain in neck and arm sx are better  Left occipital discomfort   New pillow  RESP:  Using Symbicort. : Qd and every other day  "prn"    Doing well . No flares  Mood : fluoxetine daily    For about a year   Helped with stress and anxiety.  In the last 2-3  Months  Some increase in stress . Job stress adding.    7 days a week.   / if increasing dose . No sever depression  No issues  menopausal  On Prometrium per gyne   Review of Systems No cp sob fever uti    Rest as per hpi  Past Medical History  Diagnosis Date  . Asthma     inhaler use for 4 years  . Depression   . Joint pain     History   Social History  . Marital Status: Divorced    Spouse Name: N/A    Number of Children: N/A  . Years of Education: N/A   Occupational History  . Not on file.   Social History Main Topics  . Smoking status: Former Games developer  . Smokeless tobacco: Not on file  . Alcohol Use: Yes     socially  . Drug Use: No  . Sexually Active:    Other Topics Concern  . Not on file   Social History Narrative   DivorcedG1P1 1988HH of [place] 50-55 hours per week. RNETS as child, mom and gp smoked    Past Surgical History  Procedure Date  . Tonsillectomy     Family History  Problem Relation Age of Onset  . COPD Mother   . Arthritis Mother   . Depression Mother   . Alcohol abuse Father     deceased age 26    Allergies  Allergen Reactions  . Penicillins     Current Outpatient Prescriptions on File Prior to Visit  Medication Sig Dispense Refill  . budesonide-formoterol  (SYMBICORT) 160-4.5 MCG/ACT inhaler Inhale 2 puffs into the lungs 2 (two) times daily.  3 Inhaler  3  . fish oil-omega-3 fatty acids 1000 MG capsule Take 2 g by mouth daily.        . MULTIPLE VITAMIN PO Take by mouth.        . progesterone (PROMETRIUM) 100 MG capsule Take 100 mg by mouth daily.        . naproxen (NAPROSYN) 500 MG tablet Take 1 tablet (500 mg total) by mouth 2 (two) times daily with a meal.  40 tablet  1  . NON FORMULARY Natural Herbs- premenopausal         BP 120/76  Pulse 83  Temp(Src) 98.1 F (36.7 C) (Oral)  Wt 200 lb (90.719 kg)  SpO2 98%       Objective:   Physical Exam WDWN in nad  HEENT: Normocephalic ;atraumatic , Eyes;  PERRL, EOMs  Full, lids and conjunctiva clear,,Ears: no deformities, canals nl, TM landmarks normal, Nose: no deformity or discharge  Mouth : OP clear without lesion or edema . Neck:  without adenopathy or masses or bruits slightly stiff but adequate rom  Points to occipital attachment of cervical m as area of discomfort. Chest:  Clear to A&P without wheezes rales or rhonchi CV:  S1-S2 no gallops or murmurs peripheral perfusion is normal Abdomen:  Sof,t normal bowel sounds without hepatosplenomegaly, no guarding rebound or masses no CVA tenderness NEURO: oriented x 3 CN 3-12 appear intact. No focal muscle weakness or atrophy. DTRs symmetrical. Gait WNL.  Grossly non focal. No tremor or abnormal movement.  Psych or x 3 good eye contact nl affect and insight.      Assessment & Plan:  Mood  On fluoextine with help but some poss external triggers and perhaps better resonse with  Increase dose . anx and dep mood issues .    Menopausal.  optinos discussed  Will increase to 30 mg per day and then 40 if needed. Call for refill s  Local pharm for now until med dose confirmed  Asthma  Stable  dosc use of controller med .  Neck  Pain  With hx of arm pain radiating  Improved prob  arthritis and  stress and body posture.  She wants to wait and see how  stress and postural interventions help and consider another opinion call if alarm sx .   (MRI reviewed  c spine sig degenerative disease foraminal disease and bulging disc and facet diseases c7 t 1  Done spring of 2012 ) .i

## 2011-02-18 NOTE — Telephone Encounter (Signed)
Please resend her new rx for Paxil to [facility] mail order.(  See info in her file). Her local pharmacy cannot fill this  whole rx. She will get 10 pills of the rx. Please call patient if any confusion. Thanks. Also let her know when mail order is sent. Thanks.

## 2011-02-19 ENCOUNTER — Encounter: Payer: Self-pay | Admitting: Internal Medicine

## 2011-02-19 DIAGNOSIS — F4323 Adjustment disorder with mixed anxiety and depressed mood: Secondary | ICD-10-CM | POA: Insufficient documentation

## 2011-02-20 MED ORDER — FLUOXETINE HCL 20 MG PO TABS: 30.0000 mg | ORAL_TABLET | Freq: Every day | ORAL | Status: DC

## 2011-02-20 NOTE — Telephone Encounter (Signed)
Rx sent to [facility]

## 2011-02-21 ENCOUNTER — Telehealth: Payer: Self-pay | Admitting: Internal Medicine

## 2011-02-21 NOTE — Telephone Encounter (Signed)
Pr

## 2011-10-22 ENCOUNTER — Other Ambulatory Visit: Payer: Self-pay | Admitting: Obstetrics and Gynecology

## 2011-10-23 ENCOUNTER — Encounter: Payer: Self-pay | Admitting: Family Medicine

## 2011-10-23 ENCOUNTER — Other Ambulatory Visit: Payer: Self-pay | Admitting: Internal Medicine

## 2011-10-29 ENCOUNTER — Encounter: Payer: Self-pay | Admitting: Internal Medicine

## 2011-10-29 ENCOUNTER — Ambulatory Visit (INDEPENDENT_AMBULATORY_CARE_PROVIDER_SITE_OTHER): Payer: BLUE CROSS/BLUE SHIELD | Admitting: Internal Medicine

## 2011-10-29 VITALS — BP 138/80 | HR 92 | Temp 98.5°F | Wt 202.0 lb

## 2011-10-29 DIAGNOSIS — L989 Disorder of the skin and subcutaneous tissue, unspecified: Secondary | ICD-10-CM

## 2011-10-29 DIAGNOSIS — R229 Localized swelling, mass and lump, unspecified: Secondary | ICD-10-CM

## 2011-10-29 DIAGNOSIS — R2230 Localized swelling, mass and lump, unspecified upper limb: Secondary | ICD-10-CM

## 2011-10-29 DIAGNOSIS — N951 Menopausal and female climacteric states: Secondary | ICD-10-CM

## 2011-10-29 DIAGNOSIS — R062 Wheezing: Secondary | ICD-10-CM

## 2011-10-29 DIAGNOSIS — F4323 Adjustment disorder with mixed anxiety and depressed mood: Secondary | ICD-10-CM

## 2011-10-29 MED ORDER — ALBUTEROL SULFATE HFA 108 (90 BASE) MCG/ACT IN AERS: 2.0000 | INHALATION_SPRAY | Freq: Four times a day (QID) | RESPIRATORY_TRACT | Status: DC | PRN

## 2011-10-29 NOTE — Progress Notes (Signed)
  Subjective:    Patient ID: Desiree Cooper, female    DOB: [birth year], 51 y.o.   MRN: [id number]  HPI Patient comes in today for follow up of  multiple medical problems.  But mostly for med evaluation. Last OV with pcp was  12 12    Since then has been taking    2 -20 mg pf prozac per day.  Doing very well.  Describes more  energy and and better day to day  functioning.    Taking progesterone  Hs   To  Help with menopausal sx  Per gyne    ASTHMA: Has 2 inhalers  Not taking every day.   Uses rescue prn .  Not reg  symbicort few times per week.Ventolin about  Few times per month.  Helps.  No ets.   Usually.   Except with pt .   Check skin area black area     And had sore  Under arms.   Review of Systems No fever chills cp sob otherwise major injury bleeding    Past history family history social history reviewed in the electronic medical record.     Objective:   Physical Exam BP 138/80  Pulse 92  Temp 98.5 F (36.9 C) (Oral)  Wt 202 lb (91.627 kg)  SpO2 97%  LMP 05/04/2010 wdwn in nad  HEENT  [place] at eyes clear op tongue midline  Neck: Supple without adenopathy or masses or bruits Chest:  Clear to A&P without wheezes rales or rhonchi CV:  S1-S2 no gallops or murmurs peripheral perfusion is normal Abdomen:  Sof,t normal bowel sounds without hepatosplenomegaly, no guarding rebound or masses no CVA tenderness No clubbing cyanosis or edema SKIn no bleeding bruising  Area right chest with pink irritated area smooth  Axilla  No ab mass or adenopathy breast lumpy but no discretemass    Assessment & Plan:  Mood  Appears stable  oin current regimen continue   Asthmatic  Rad  Ex tobacco    Dis and ok to use as needed when at risk exposures    Advise flu vaccine hasnt used in past .  Mole check   Call with name of derm if continues ( someone  In [place]) Axillar sx .   No obv alarm findings on exam  To follow  Perimenopause  As per GYNE  Total visit > 50% spent counseling and  coordinating care

## 2011-10-29 NOTE — Patient Instructions (Signed)
Continue lifestyle intervention healthy eating and exercise . Refills when due  Consider  Using symbicort daily for suppression of asthma sx .  Or consider down shift to  Inhaled cortisone such as Q var for control. Strongly advise getting flu immunization.  At this time  Would watch the skin area   If not healing and continues to scale then call for derm referrals.

## 2011-11-01 ENCOUNTER — Ambulatory Visit (INDEPENDENT_AMBULATORY_CARE_PROVIDER_SITE_OTHER): Payer: BC Managed Care – PPO | Admitting: Obstetrics and Gynecology

## 2011-11-01 ENCOUNTER — Encounter: Payer: Self-pay | Admitting: Obstetrics and Gynecology

## 2011-11-01 VITALS — BP 120/72 | Ht 62.5 in | Wt 203.0 lb

## 2011-11-01 DIAGNOSIS — Z1321 Encounter for screening for nutritional disorder: Secondary | ICD-10-CM

## 2011-11-01 DIAGNOSIS — N951 Menopausal and female climacteric states: Secondary | ICD-10-CM

## 2011-11-01 DIAGNOSIS — Z130 Encounter for screening for diseases of the blood and blood-forming organs and certain disorders involving the immune mechanism: Secondary | ICD-10-CM

## 2011-11-01 DIAGNOSIS — Z124 Encounter for screening for malignant neoplasm of cervix: Secondary | ICD-10-CM

## 2011-11-01 DIAGNOSIS — R6882 Decreased libido: Secondary | ICD-10-CM

## 2011-11-01 DIAGNOSIS — Z1329 Encounter for screening for other suspected endocrine disorder: Secondary | ICD-10-CM

## 2011-11-01 DIAGNOSIS — Z01419 Encounter for gynecological examination (general) (routine) without abnormal findings: Secondary | ICD-10-CM

## 2011-11-01 MED ORDER — AMBULATORY NON FORMULARY MEDICATION: 0.1000 mg | Freq: Every day | Status: DC

## 2011-11-01 NOTE — Progress Notes (Signed)
Subjective:    Desiree Cooper is a 51 y.o. female, G1P1, who presents for an annual exam. The patient complains of hot flashes that wax and wanes with times that they are worse than others-particularly at night.  States she has no libido (same sex partner) for 2 years.   Recently had a lump under left arm that was not painful but has now resolved.  Menstrual cycle:   LMP: Patient's last menstrual period was 05/04/2010.             Review of Systems Pertinent items are noted in HPI. Denies pelvic pain, urinary tract symptoms, vaginitis symptoms, irregular bleeding, menopausal symptoms, change in bowel habits or rectal bleeding   Objective:    BP 120/72  Ht 5' 2.5" (1.588 m)  Wt 203 lb (92.08 kg)  BMI 36.54 kg/m2  LMP 05/04/2010   Wt Readings from Last 1 Encounters:  11/01/11 203 lb (92.08 kg)   Body mass index is 36.54 kg/(m^2). General Appearance: Alert, no acute distress HEENT: Grossly normal Neck / Thyroid: Supple, no thyromegaly or cervical adenopathy Lungs: Clear to auscultation bilaterally Back: No CVA tenderness Breast Exam: No masses or nodes.No dimpling, nipple retraction or discharge. Cardiovascular: Regular rate and rhythm.  Gastrointestinal: Soft, non-tender, no masses or organomegaly Pelvic Exam: EGBUS-wnl, vagina-relaxation no lesions, cervix- without lesions or tenderness, uterus appears normal size shape and consistency, adnexae-no masses or tenderness Rectovaginal: no masses and normal sphincter tone; small hemorrhoid Lymphatic Exam: Non-palpable nodes in neck, clavicular,  axillary, or inguinal regions  Skin: no rashes or abnormalities Extremities: no clubbing cyanosis or edema  Neurologic: grossly normal Psychiatric: Alert and oriented  Assessment:   Routine GYN Exam Vasomotor symptoms Decreased Libido   Plan:  Thyroid Panel, Vitamin D.25-H-pending  Offered referral to a Sex Therapist-patient declined  Testosterone 0.1 mg/22ml  #30 ml  1 ml topically qd  4 refills  Reviewed with patient the risks of excess testosterone to include, but not limited to: increased lipids, unwanted hair growth, female pattern baldness, deepening of voice, clitoromegaly, acne, oily skin and aggression.  Increase progesterone to take Prometrium 200 mg or OTC Progesterone daily as directed and alternating sites of application   Reviewed causes of decreased libido in women  PAP sent-pending  RTO 1 year or prn  Dabney Schanz,ELMIRAPA-C

## 2011-11-01 NOTE — Progress Notes (Signed)
The patient is taking hormone replacement therapy The patient  is not taking a Calcium supplement. Post-menopausal bleeding:no  Last Pap: was normal September  2012 Last mammogram: was normal 2011 Last DEXA scan : na Last colonoscopy:no  Urinary symptoms: none Normal bowel movements: Yes Reports abuse at home: No:   Pt would like to discuss low libido and is requesting TSH today.

## 2011-11-01 NOTE — Patient Instructions (Addendum)
Ask about Effexor & Wellbutrin  Look for USP  Progesterone  use as directed daily alternate site of application each day

## 2011-11-02 LAB — THYROID PANEL WITH TSH
Free Thyroxine Index: 3.4 (ref 1.0–3.9)
T3 Uptake: 35.5 % (ref 22.5–37.0)
TSH: 1.462 u[IU]/mL (ref 0.350–4.500)

## 2011-11-04 ENCOUNTER — Encounter: Payer: Self-pay | Admitting: Internal Medicine

## 2011-11-04 DIAGNOSIS — R2230 Localized swelling, mass and lump, unspecified upper limb: Secondary | ICD-10-CM | POA: Insufficient documentation

## 2011-11-04 DIAGNOSIS — L989 Disorder of the skin and subcutaneous tissue, unspecified: Secondary | ICD-10-CM | POA: Insufficient documentation

## 2011-11-04 LAB — PAP IG W/ RFLX HPV ASCU

## 2011-11-27 ENCOUNTER — Telehealth: Payer: Self-pay | Admitting: Family Medicine

## 2011-11-27 NOTE — Telephone Encounter (Signed)
Ok to send rx for ProAir?

## 2011-11-27 NOTE — Telephone Encounter (Signed)
Ok to use proair   hfa

## 2011-11-27 NOTE — Telephone Encounter (Signed)
Insurance will not cover pt's Proventil inhaler until she tries Proair inhaler first. I did not see it on the med list as a previous med. Please advise. Thank you.

## 2011-11-28 ENCOUNTER — Other Ambulatory Visit: Payer: Self-pay | Admitting: Internal Medicine

## 2011-11-28 MED ORDER — ALBUTEROL SULFATE HFA 108 (90 BASE) MCG/ACT IN AERS: 2.0000 | INHALATION_SPRAY | Freq: Four times a day (QID) | RESPIRATORY_TRACT | Status: DC | PRN

## 2011-11-28 NOTE — Telephone Encounter (Signed)
Sent to [facility] by [facility].

## 2011-11-29 ENCOUNTER — Other Ambulatory Visit: Payer: Self-pay | Admitting: Obstetrics and Gynecology

## 2011-11-29 DIAGNOSIS — Z1231 Encounter for screening mammogram for malignant neoplasm of breast: Secondary | ICD-10-CM

## 2011-12-05 ENCOUNTER — Ambulatory Visit
Admission: RE | Admit: 2011-12-05 | Discharge: 2011-12-05 | Disposition: A | Discharge status: Home / Self Care | Payer: BC Managed Care – PPO | Source: Ambulatory Visit | Attending: Obstetrics and Gynecology | Admitting: Obstetrics and Gynecology

## 2011-12-05 DIAGNOSIS — Z1231 Encounter for screening mammogram for malignant neoplasm of breast: Secondary | ICD-10-CM

## 2012-03-16 ENCOUNTER — Telehealth: Payer: Self-pay | Admitting: Internal Medicine

## 2012-03-16 NOTE — Telephone Encounter (Signed)
Pt requesting rx refill FLUoxetine (PROZAC) 20 MG tablet /Pharmacy: [facility] [phone].

## 2012-03-17 ENCOUNTER — Other Ambulatory Visit: Payer: Self-pay | Admitting: Obstetrics and Gynecology

## 2012-03-17 ENCOUNTER — Telehealth: Payer: Self-pay | Admitting: Obstetrics and Gynecology

## 2012-03-17 ENCOUNTER — Other Ambulatory Visit: Payer: Self-pay | Admitting: Family Medicine

## 2012-03-17 MED ORDER — FLUOXETINE HCL 20 MG PO TABS: ORAL_TABLET | ORAL | Status: DC

## 2012-03-17 NOTE — Telephone Encounter (Signed)
VM from pt. Requesting RF Progesterone 200 mg.  Pt [phone]   Pharmacy [phone]

## 2012-03-17 NOTE — Telephone Encounter (Signed)
Sent by [facility]. 

## 2012-03-18 NOTE — Telephone Encounter (Signed)
Tc to pt regarding msg.  Lm on vm that request for Progesterone approved per EP and has been faxed to her mail order pharmacy.  Pt advised to call w/ any questions.

## 2012-03-24 ENCOUNTER — Telehealth: Payer: Self-pay | Admitting: Obstetrics and Gynecology

## 2012-03-24 NOTE — Telephone Encounter (Signed)
VM from pharmacy 03/23/12 at 12:30 PM.   Need clarification of fluoxitine. [phone].  Ref # [id number]

## 2012-03-24 NOTE — Telephone Encounter (Signed)
Tc to [facility] pharmacy, spoke w/ representative regarding directions of Fluoxetine.  They have received two different rxs, one from Dr. Fabian Sharp and one from this office written by EP.  Pt has already received rx written by Dr. Fabian Sharp, will cancel out rx written by EP.

## 2012-03-27 ENCOUNTER — Telehealth: Payer: Self-pay | Admitting: Obstetrics and Gynecology

## 2012-03-27 ENCOUNTER — Other Ambulatory Visit: Payer: Self-pay | Admitting: Obstetrics and Gynecology

## 2012-03-27 NOTE — Telephone Encounter (Signed)
This pt lost her rx to progesterone. Can she have another?

## 2012-03-27 NOTE — Telephone Encounter (Signed)
Patient lost her prescription for Progesterone 200 mg and would like another.  She may have a prescription for Progesterone 200 mg   #30 1 po qhs 11 refills.  Dynasti Kerman,PA-C

## 2012-04-01 MED ORDER — PROGESTERONE MICRONIZED 100 MG PO CAPS: ORAL_CAPSULE | ORAL | Status: DC

## 2012-04-01 NOTE — Telephone Encounter (Signed)
rx sent to pharmacy per EP

## 2012-06-23 ENCOUNTER — Other Ambulatory Visit: Payer: Self-pay | Admitting: Internal Medicine

## 2012-08-21 ENCOUNTER — Other Ambulatory Visit: Payer: Self-pay | Admitting: Internal Medicine

## 2012-11-19 ENCOUNTER — Other Ambulatory Visit: Payer: Self-pay | Admitting: Internal Medicine

## 2012-12-22 ENCOUNTER — Other Ambulatory Visit (INDEPENDENT_AMBULATORY_CARE_PROVIDER_SITE_OTHER): Payer: BC Managed Care – PPO

## 2012-12-22 ENCOUNTER — Encounter (INDEPENDENT_AMBULATORY_CARE_PROVIDER_SITE_OTHER): Payer: Self-pay

## 2012-12-22 DIAGNOSIS — E669 Obesity, unspecified: Secondary | ICD-10-CM

## 2012-12-22 DIAGNOSIS — R635 Abnormal weight gain: Secondary | ICD-10-CM

## 2012-12-23 ENCOUNTER — Ambulatory Visit (INDEPENDENT_AMBULATORY_CARE_PROVIDER_SITE_OTHER): Payer: BC Managed Care – PPO | Admitting: Family Medicine

## 2012-12-23 ENCOUNTER — Encounter: Payer: Self-pay | Admitting: Family Medicine

## 2012-12-23 LAB — CBC WITH DIFFERENTIAL/PLATELET
Eos: 7 %
HCT: 44.3 % (ref 34.0–46.6)
Immature Granulocytes: 0 %
Lymphocytes Absolute: 2.3 10*3/uL (ref 0.7–3.1)
MCH: 29 pg (ref 26.6–33.0)
MCHC: 32.7 g/dL (ref 31.5–35.7)
MCV: 89 fL (ref 79–97)
Monocytes Absolute: 0.7 10*3/uL (ref 0.1–0.9)
RDW: 13.2 % (ref 12.3–15.4)
WBC: 9.1 10*3/uL (ref 3.4–10.8)

## 2012-12-23 LAB — CMP14+EGFR
Albumin/Globulin Ratio: 1.9 (ref 1.1–2.5)
Albumin: 4.5 g/dL (ref 3.5–5.5)
BUN: 12 mg/dL (ref 6–24)
Creatinine, Ser: 0.74 mg/dL (ref 0.57–1.00)
GFR calc Af Amer: 107 mL/min/{1.73_m2} (ref >59)
GFR calc non Af Amer: 93 mL/min/{1.73_m2} (ref >59)
Globulin, Total: 2.4 g/dL (ref 1.5–4.5)
Glucose: 107 mg/dL — ABNORMAL HIGH (ref 65–99)
Total Bilirubin: 0.3 mg/dL (ref 0.0–1.2)
Total Protein: 6.9 g/dL (ref 6.0–8.5)

## 2012-12-23 NOTE — Patient Instructions (Signed)
Check with insurance about colonoscopy coverage and follow-up with [place] to schedule it.

## 2012-12-24 ENCOUNTER — Telehealth: Payer: Self-pay | Admitting: Family Medicine

## 2012-12-25 ENCOUNTER — Other Ambulatory Visit: Payer: Self-pay | Admitting: Internal Medicine

## 2012-12-25 NOTE — Telephone Encounter (Signed)
Please review labs for patient  

## 2013-04-23 ENCOUNTER — Encounter: Payer: Self-pay | Admitting: Family Medicine

## 2013-04-23 ENCOUNTER — Ambulatory Visit (INDEPENDENT_AMBULATORY_CARE_PROVIDER_SITE_OTHER): Payer: BC Managed Care – PPO | Admitting: Family Medicine

## 2013-04-23 ENCOUNTER — Encounter (INDEPENDENT_AMBULATORY_CARE_PROVIDER_SITE_OTHER): Payer: Self-pay

## 2013-04-23 VITALS — BP 120/76 | HR 76 | Temp 97.2°F | Ht 63.0 in | Wt 204.0 lb

## 2013-04-23 DIAGNOSIS — F329 Major depressive disorder, single episode, unspecified: Secondary | ICD-10-CM

## 2013-04-23 DIAGNOSIS — J45909 Unspecified asthma, uncomplicated: Secondary | ICD-10-CM

## 2013-04-23 DIAGNOSIS — F3289 Other specified depressive episodes: Secondary | ICD-10-CM

## 2013-04-23 DIAGNOSIS — F32A Depression, unspecified: Secondary | ICD-10-CM

## 2013-04-23 MED ORDER — SERTRALINE HCL 50 MG PO TABS: 50.0000 mg | ORAL_TABLET | Freq: Every day | ORAL | Status: DC

## 2013-04-23 MED ORDER — BUDESONIDE-FORMOTEROL FUMARATE 160-4.5 MCG/ACT IN AERO: 2.0000 | INHALATION_SPRAY | Freq: Two times a day (BID) | RESPIRATORY_TRACT | Status: DC

## 2013-04-23 MED ORDER — ALBUTEROL SULFATE HFA 108 (90 BASE) MCG/ACT IN AERS: 2.0000 | INHALATION_SPRAY | Freq: Four times a day (QID) | RESPIRATORY_TRACT | Status: DC | PRN

## 2013-04-23 NOTE — Progress Notes (Signed)
   Subjective:    Patient ID: Desiree Cooper, female    DOB: 24-Jul-1960, 53 y.o.   MRN: [id number]  HPI  This 53 y.o. female presents for evaluation of needing refill on her asthma medication.  She has Hx of depression and she wants to get back on the depression medicine. She has been having some anxiety and she has some insomnia.  Review of Systems C/o anxiety and insomnia   No chest pain, SOB, HA, dizziness, vision change, N/V, diarrhea, constipation, dysuria, urinary urgency or frequency, myalgias, arthralgias or rash.  Objective:   Physical Exam  Vital signs noted  Well developed well nourished female.  HEENT - Head atraumatic Normocephalic                Eyes - PERRLA, Conjuctiva - clear Sclera- Clear EOMI                Ears - EAC's Wnl TM's Wnl Gross Hearing WNL                Throat - oropharanx wnl Respiratory - Lungs CTA bilateral Cardiac - RRR S1 and S2 without murmur GI - Abdomen soft Nontender and bowel sounds active x 4 Extremities - No edema. Neuro - Grossly intact.      Assessment & Plan:  Asthma - Plan: budesonide-formoterol (SYMBICORT) 160-4.5 MCG/ACT inhaler, albuterol (PROVENTIL HFA;VENTOLIN HFA) 108 (90 BASE) MCG/ACT inhaler  Depression - Plan: sertraline (ZOLOFT) 50 MG tablet po qd  Lysbeth Penner FNP

## 2013-08-09 ENCOUNTER — Telehealth: Payer: Self-pay | Admitting: Family Medicine

## 2013-08-10 ENCOUNTER — Other Ambulatory Visit: Payer: Self-pay | Admitting: Family Medicine

## 2013-08-10 NOTE — Telephone Encounter (Signed)
Please follow up to discuss depression and meds etc.

## 2013-08-16 ENCOUNTER — Encounter: Payer: Self-pay | Admitting: Family Medicine

## 2013-08-16 ENCOUNTER — Telehealth: Payer: Self-pay | Admitting: Family Medicine

## 2013-08-16 ENCOUNTER — Ambulatory Visit (INDEPENDENT_AMBULATORY_CARE_PROVIDER_SITE_OTHER): Payer: BC Managed Care – PPO

## 2013-08-16 ENCOUNTER — Ambulatory Visit (INDEPENDENT_AMBULATORY_CARE_PROVIDER_SITE_OTHER): Payer: BC Managed Care – PPO | Admitting: Family Medicine

## 2013-08-16 VITALS — BP 121/80 | HR 76 | Temp 98.8°F | Ht 63.0 in | Wt 207.8 lb

## 2013-08-16 DIAGNOSIS — IMO0002 Reserved for concepts with insufficient information to code with codable children: Secondary | ICD-10-CM

## 2013-08-16 DIAGNOSIS — F3289 Other specified depressive episodes: Secondary | ICD-10-CM

## 2013-08-16 DIAGNOSIS — F329 Major depressive disorder, single episode, unspecified: Secondary | ICD-10-CM

## 2013-08-16 DIAGNOSIS — F32A Depression, unspecified: Secondary | ICD-10-CM

## 2013-08-16 DIAGNOSIS — M5410 Radiculopathy, site unspecified: Secondary | ICD-10-CM

## 2013-08-16 MED ORDER — CYCLOBENZAPRINE HCL 10 MG PO TABS: 10.0000 mg | ORAL_TABLET | Freq: Three times a day (TID) | ORAL | Status: DC | PRN

## 2013-08-16 MED ORDER — NAPROXEN 500 MG PO TABS: 500.0000 mg | ORAL_TABLET | Freq: Two times a day (BID) | ORAL | Status: DC

## 2013-08-16 MED ORDER — BUPROPION HCL ER (SR) 150 MG PO TB12: 150.0000 mg | ORAL_TABLET | Freq: Two times a day (BID) | ORAL | Status: DC

## 2013-08-16 NOTE — Progress Notes (Signed)
   Subjective:    Patient ID: Desiree Cooper, female    DOB: 1960/12/01, 53 y.o.   MRN: [id number]  HPI  This 53 y.o. female presents for evaluation of back pain.  She injured her back When she bent over to pick something up at home and pulled the muscles in her Back. She has been having discomfort that is moderate to severe.  She is also experiencing Some depression.  Review of Systems C/o back pain and depression   No chest pain, SOB, HA, dizziness, vision change, N/V, diarrhea, constipation, dysuria, urinary urgency or frequency, myalgias, arthralgias or rash.  Objective:   Physical Exam  Vital signs noted  Well developed well nourished female.  HEENT - Head atraumatic Normocephalic                Eyes - PERRLA, Conjuctiva - clear Sclera- Clear EOMI                Ears - EAC's Wnl TM's Wnl Gross Hearing WNL                Throat - oropharanx wnl Respiratory - Lungs CTA bilateral Cardiac - RRR S1 and S2 without murmur GI - Abdomen soft Nontender and bowel sounds active x 4 MS - TTP bilateral LS paraspinous muscles.  Decreased ROM LS spine  Xray of LS spine - No fx Prelimnary reading by Iverson Alamin    Assessment & Plan:  Back pain with right-sided radiculopathy - Plan: DG Lumbar Spine 2-3 Views, naproxen (NAPROSYN) 500 MG tablet, cyclobenzaprine (FLEXERIL) 10 MG tablet  Depression - Plan: buPROPion (WELLBUTRIN SR) 150 MG 12 hr tablet  Follow up in one month and prn  Lysbeth Penner FNP

## 2013-08-16 NOTE — Telephone Encounter (Signed)
appt given for today with bill oxford 

## 2013-08-17 NOTE — Telephone Encounter (Signed)
appt scheduled

## 2013-08-22 NOTE — Progress Notes (Signed)
Pt not seen.

## 2013-12-10 ENCOUNTER — Telehealth: Payer: Self-pay | Admitting: Family Medicine

## 2013-12-10 NOTE — Telephone Encounter (Signed)
I am not aware of that

## 2013-12-11 NOTE — Telephone Encounter (Signed)
New medication is called Fibranserin  100 mg , one qHS.  This has been approved by FDA.

## 2013-12-13 ENCOUNTER — Encounter: Payer: Self-pay | Admitting: Nurse Practitioner

## 2013-12-13 ENCOUNTER — Other Ambulatory Visit: Payer: Self-pay | Admitting: Nurse Practitioner

## 2013-12-14 NOTE — Telephone Encounter (Signed)
Medicine is still  Not available for pharmacy to get yet- patient aware

## 2013-12-20 ENCOUNTER — Encounter: Payer: Self-pay | Admitting: Family Medicine

## 2014-01-07 ENCOUNTER — Encounter: Payer: Self-pay | Admitting: Family Medicine

## 2014-01-07 ENCOUNTER — Ambulatory Visit (INDEPENDENT_AMBULATORY_CARE_PROVIDER_SITE_OTHER): Payer: BC Managed Care – PPO | Admitting: Family Medicine

## 2014-01-07 VITALS — BP 127/79 | HR 79 | Temp 98.7°F | Ht 63.0 in | Wt 204.6 lb

## 2014-01-07 DIAGNOSIS — Z0000 Encounter for general adult medical examination without abnormal findings: Secondary | ICD-10-CM

## 2014-01-07 NOTE — Progress Notes (Signed)
   Subjective:    Patient ID: Desiree Cooper, female    DOB: [birth year], 53 y.o.   MRN: [id number]  HPI Patient is here for work physical.  She denies any illness or problems.  Review of Systems  Constitutional: Negative for fever.  HENT: Negative for ear pain.   Eyes: Negative for discharge.  Respiratory: Negative for cough.   Cardiovascular: Negative for chest pain.  Gastrointestinal: Negative for abdominal distention.  Endocrine: Negative for polyuria.  Genitourinary: Negative for difficulty urinating.  Musculoskeletal: Negative for gait problem and neck pain.  Skin: Negative for color change and rash.  Neurological: Negative for speech difficulty and headaches.  Psychiatric/Behavioral: Negative for agitation.       Objective:    BP 127/79 mmHg  Pulse 79  Temp(Src) 98.7 F (37.1 C) (Oral)  Ht 5\' 3"  (1.6 m)  Wt 204 lb 9.6 oz (92.806 kg)  BMI 36.25 kg/m2  LMP 05/04/2010 Physical Exam  Constitutional: She is oriented to person, place, and time. She appears well-developed and well-nourished.  HENT:  Head: Normocephalic and atraumatic.  Mouth/Throat: Oropharynx is clear and moist.  Eyes: Pupils are equal, round, and reactive to light.  Neck: Normal range of motion. Neck supple.  Cardiovascular: Normal rate and regular rhythm.   No murmur heard. Pulmonary/Chest: Effort normal and breath sounds normal.  Abdominal: Soft. Bowel sounds are normal. There is no tenderness.  Neurological: She is alert and oriented to person, place, and time.  Skin: Skin is warm and dry.  Psychiatric: She has a normal mood and affect.          Assessment & Plan:     ICD-9-CM ICD-10-CM   1. Routine general medical examination at a health care facility V70.0 Z00.00      Return if symptoms worsen or fail to improve.  Lysbeth Penner FNP

## 2014-02-23 ENCOUNTER — Other Ambulatory Visit: Payer: Self-pay | Admitting: Nurse Practitioner

## 2014-02-23 ENCOUNTER — Other Ambulatory Visit: Payer: Self-pay

## 2014-02-23 DIAGNOSIS — N644 Mastodynia: Secondary | ICD-10-CM

## 2014-02-25 ENCOUNTER — Other Ambulatory Visit: Payer: Self-pay | Admitting: Nurse Practitioner

## 2014-02-25 DIAGNOSIS — N644 Mastodynia: Secondary | ICD-10-CM

## 2014-02-28 ENCOUNTER — Encounter (INDEPENDENT_AMBULATORY_CARE_PROVIDER_SITE_OTHER): Payer: Self-pay

## 2014-02-28 ENCOUNTER — Ambulatory Visit
Admission: RE | Admit: 2014-02-28 | Discharge: 2014-02-28 | Disposition: A | Discharge status: Home / Self Care | Payer: BLUE CROSS/BLUE SHIELD | Source: Ambulatory Visit | Attending: Nurse Practitioner | Admitting: Nurse Practitioner

## 2014-02-28 DIAGNOSIS — N644 Mastodynia: Secondary | ICD-10-CM

## 2014-04-23 LAB — PULMONARY FUNCTION TEST

## 2014-05-16 ENCOUNTER — Other Ambulatory Visit: Payer: Self-pay | Admitting: Family Medicine

## 2014-05-29 ENCOUNTER — Other Ambulatory Visit: Payer: Self-pay | Admitting: Nurse Practitioner

## 2014-05-30 NOTE — Telephone Encounter (Signed)
Left patient a voicemail to call and schedule a follow up appointment.

## 2014-06-08 ENCOUNTER — Ambulatory Visit: Payer: Self-pay | Admitting: Family Medicine

## 2014-08-23 ENCOUNTER — Ambulatory Visit (INDEPENDENT_AMBULATORY_CARE_PROVIDER_SITE_OTHER): Payer: BLUE CROSS/BLUE SHIELD | Admitting: Family

## 2014-08-23 ENCOUNTER — Encounter: Payer: Self-pay | Admitting: Family

## 2014-08-23 VITALS — BP 120/85 | HR 79 | Temp 97.9°F | Ht 63.0 in | Wt 194.6 lb

## 2014-08-23 DIAGNOSIS — F329 Major depressive disorder, single episode, unspecified: Secondary | ICD-10-CM | POA: Diagnosis not present

## 2014-08-23 DIAGNOSIS — E669 Obesity, unspecified: Secondary | ICD-10-CM | POA: Diagnosis not present

## 2014-08-23 DIAGNOSIS — J45909 Unspecified asthma, uncomplicated: Secondary | ICD-10-CM | POA: Insufficient documentation

## 2014-08-23 DIAGNOSIS — J4520 Mild intermittent asthma, uncomplicated: Secondary | ICD-10-CM

## 2014-08-23 DIAGNOSIS — F32A Depression, unspecified: Secondary | ICD-10-CM

## 2014-08-23 MED ORDER — BUPROPION HCL ER (XL) 150 MG PO TB24: 150.0000 mg | ORAL_TABLET | Freq: Every day | ORAL | Status: DC

## 2014-08-23 NOTE — Patient Instructions (Signed)

## 2014-08-23 NOTE — Progress Notes (Signed)
   Subjective:    Patient ID: Desiree Cooper, female    DOB: 07-25-1960, 54 y.o.   MRN: [id number]  Pt presents to the office today for chronic follow up. Pt was taking Wellbutrin 150 mg for depression. Pt states she has been out for 2 months, and tried "going with out it". However, pt states she feels "up tight" and is moody. Pt would like to restart it medication. She also has a history of asthma. Pt states it is well controlled and only needs her albuterol inhaler about two times a year- when the weather changes.  Asthma There is no cough, difficulty breathing, frequent throat clearing, shortness of breath or wheezing. This is a chronic problem. The current episode started more than 1 year ago. The problem occurs rarely. The problem has been waxing and waning. Pertinent negatives include no headaches, myalgias, nasal congestion, postnasal drip, sneezing or sore throat. Her symptoms are aggravated by pollen. Her symptoms are alleviated by rest and ipratropium. She reports significant improvement on treatment. Her past medical history is significant for asthma.      Review of Systems  Constitutional: Negative.   HENT: Negative.  Negative for postnasal drip, sneezing and sore throat.   Eyes: Negative.   Respiratory: Negative.  Negative for cough, shortness of breath and wheezing.   Cardiovascular: Negative.  Negative for palpitations.  Gastrointestinal: Negative.   Endocrine: Negative.   Genitourinary: Negative.   Musculoskeletal: Negative.  Negative for myalgias.  Neurological: Negative.  Negative for headaches.  Hematological: Negative.   Psychiatric/Behavioral: Negative.   All other systems reviewed and are negative.      Objective:   Physical Exam  Constitutional: She is oriented to person, place, and time. She appears well-developed and well-nourished. No distress.  HENT:  Head: Normocephalic and atraumatic.  Right Ear: External ear normal.  Left Ear: External ear normal.  Nose:  Nose normal.  Mouth/Throat: Oropharynx is clear and moist.  Eyes: Pupils are equal, round, and reactive to light.  Neck: Normal range of motion. Neck supple. No thyromegaly present.  Cardiovascular: Normal rate, regular rhythm, normal heart sounds and intact distal pulses.   No murmur heard. Pulmonary/Chest: Effort normal and breath sounds normal. No respiratory distress. She has no wheezes.  Abdominal: Soft. Bowel sounds are normal. She exhibits no distension. There is no tenderness.  Musculoskeletal: Normal range of motion. She exhibits no edema or tenderness.  Neurological: She is alert and oriented to person, place, and time. She has normal reflexes. No cranial nerve deficit.  Skin: Skin is warm and dry.  Psychiatric: She has a normal mood and affect. Her behavior is normal. Judgment and thought content normal.  Vitals reviewed.     BP 120/85 mmHg  Pulse 79  Temp(Src) 97.9 F (36.6 C) (Oral)  Ht $R'5\' 3"'vV$  (1.6 m)  Wt 194 lb 9.6 oz (88.27 kg)  BMI 34.48 kg/m2  LMP 05/04/2010     Assessment & Plan:  1. Asthma, chronic, mild intermittent, uncomplicated - NOI37+CWUG  2. Depression - CMP14+EGFR - buPROPion (WELLBUTRIN XL) 150 MG 24 hr tablet; Take 1 tablet (150 mg total) by mouth daily.  Dispense: 90 tablet; Refill: 3  3. Obesity (BMI 30.0-34.9) - CMP14+EGFR   Continue all meds Labs pending Health Maintenance reviewed Diet and exercise encouraged RTO 1 year  [name], FNP

## 2014-08-24 LAB — CMP14+EGFR
ALBUMIN: 4.5 g/dL (ref 3.5–5.5)
ALT: 32 IU/L (ref 0–32)
AST: 34 IU/L (ref 0–40)
Albumin/Globulin Ratio: 1.8 (ref 1.1–2.5)
Alkaline Phosphatase: 73 IU/L (ref 39–117)
BILIRUBIN TOTAL: 0.3 mg/dL (ref 0.0–1.2)
BUN/Creatinine Ratio: 15 (ref 9–23)
BUN: 13 mg/dL (ref 6–24)
CO2: 26 mmol/L (ref 18–29)
Calcium: 9.8 mg/dL (ref 8.7–10.2)
Chloride: 101 mmol/L (ref 97–108)
Creatinine, Ser: 0.89 mg/dL (ref 0.57–1.00)
GFR calc non Af Amer: 73 mL/min/{1.73_m2} (ref >59)
GFR, EST AFRICAN AMERICAN: 84 mL/min/{1.73_m2} (ref >59)
GLUCOSE: 93 mg/dL (ref 65–99)
Globulin, Total: 2.5 g/dL (ref 1.5–4.5)
POTASSIUM: 5.1 mmol/L (ref 3.5–5.2)
Sodium: 141 mmol/L (ref 134–144)
TOTAL PROTEIN: 7 g/dL (ref 6.0–8.5)

## 2014-08-25 ENCOUNTER — Telehealth: Payer: Self-pay | Admitting: *Deleted

## 2014-08-25 NOTE — Telephone Encounter (Signed)
-----   Message from Sharion Balloon, [place] sent at 08/24/2014  8:14 AM EDT ----- Kidney and liver function stable

## 2014-08-25 NOTE — Progress Notes (Signed)
Patient aware.

## 2014-08-25 NOTE — Telephone Encounter (Signed)
lmtcb regarding test results. 

## 2014-10-11 ENCOUNTER — Telehealth: Payer: Self-pay | Admitting: Family

## 2014-10-11 NOTE — Telephone Encounter (Signed)
Pt was prescribed long acting-24hr Wellbutrin. Does she want it changed?

## 2014-10-11 NOTE — Telephone Encounter (Signed)
Patient aware and will continue with the once a day.

## 2015-01-20 ENCOUNTER — Ambulatory Visit: Payer: BLUE CROSS/BLUE SHIELD | Admitting: Family

## 2015-01-23 ENCOUNTER — Ambulatory Visit: Payer: BLUE CROSS/BLUE SHIELD | Admitting: Family

## 2015-03-29 ENCOUNTER — Ambulatory Visit (INDEPENDENT_AMBULATORY_CARE_PROVIDER_SITE_OTHER): Payer: BLUE CROSS/BLUE SHIELD | Admitting: Family Medicine

## 2015-03-29 ENCOUNTER — Encounter: Payer: Self-pay | Admitting: Family Medicine

## 2015-03-29 ENCOUNTER — Ambulatory Visit (INDEPENDENT_AMBULATORY_CARE_PROVIDER_SITE_OTHER): Payer: BLUE CROSS/BLUE SHIELD

## 2015-03-29 VITALS — BP 143/85 | HR 81 | Temp 97.9°F | Ht 63.0 in | Wt 207.0 lb

## 2015-03-29 DIAGNOSIS — R062 Wheezing: Secondary | ICD-10-CM

## 2015-03-29 DIAGNOSIS — Z129 Encounter for screening for malignant neoplasm, site unspecified: Secondary | ICD-10-CM

## 2015-03-29 MED ORDER — MONTELUKAST SODIUM 10 MG PO TABS
10.0000 mg | ORAL_TABLET | Freq: Every day | ORAL | Status: DC
Start: 2015-03-29 — End: 2015-10-11

## 2015-03-30 ENCOUNTER — Encounter: Payer: Self-pay | Admitting: Internal Medicine

## 2015-03-30 NOTE — Progress Notes (Signed)
BP 143/85 mmHg  Pulse 81  Temp(Src) 97.9 F (36.6 C) (Oral)  Ht 5\' 3"  (1.6 m)  Wt 207 lb (93.895 kg)  BMI 36.68 kg/m2  LMP 05/04/2010   Subjective:    Patient ID: Desiree Cooper, female    DOB: Nov 18, 1960, 55 y.o.   MRN: [id number]  HPI: Desiree Cooper is a 55 y.o. female presenting on 03/29/2015 for Pain behind left arm radiating into scapula region and Shortness of Breath   HPI Cough and shortness of breath and left posterior rib pain. Patient has been having cough and shortness of breath and pain been going around the left posterior by her scapula. she denies any fevers or chills. She has been having some shortness of breath and wheezing and a lot of coughing episodes. She does have the diagnosis of asthma and has been on Symbicort but is only been using it when necessary. She works at a job where she goes into [employer] homes and when she goes into people's homes where they have smokers she feels like she has a lot of reaction to that. She does have a rescue inhaler but has not been using it very frequently. She was a previous smoker and has been told that she may have a little bit of COPD as well.she is also been having nasal drainage and postnasal drainage. The cough is nonproductive.  Relevant past medical, surgical, family and social history reviewed and updated as indicated. Interim medical history since our last visit reviewed. Allergies and medications reviewed and updated.  Review of Systems  Constitutional: Negative for fever and chills.  HENT: Positive for congestion, postnasal drip, rhinorrhea, sinus pressure, sneezing and sore throat. Negative for ear discharge and ear pain.   Eyes: Negative for pain, redness and visual disturbance.  Respiratory: Positive for cough, shortness of breath and wheezing. Negative for chest tightness.   Cardiovascular: Negative for chest pain and leg swelling.  Genitourinary: Negative for dysuria and difficulty urinating.  Musculoskeletal:  Negative for back pain and gait problem.  Skin: Negative for rash.  Neurological: Negative for light-headedness and headaches.  Psychiatric/Behavioral: Negative for behavioral problems and agitation.  All other systems reviewed and are negative.   Per HPI unless specifically indicated above     Medication List       This list is accurate as of: 03/29/15 11:59 PM.  Always use your most recent med list.               budesonide-formoterol 160-4.5 MCG/ACT inhaler  Commonly known as:  SYMBICORT  Inhale 2 puffs into the lungs 2 (two) times daily.     buPROPion 150 MG 24 hr tablet  Commonly known as:  WELLBUTRIN XL  Take 1 tablet (150 mg total) by mouth daily.     montelukast 10 MG tablet  Commonly known as:  SINGULAIR  Take 1 tablet (10 mg total) by mouth at bedtime.           Objective:    BP 143/85 mmHg  Pulse 81  Temp(Src) 97.9 F (36.6 C) (Oral)  Ht 5\' 3"  (1.6 m)  Wt 207 lb (93.895 kg)  BMI 36.68 kg/m2  LMP 05/04/2010  Wt Readings from Last 3 Encounters:  03/29/15 207 lb (93.895 kg)  08/23/14 194 lb 9.6 oz (88.27 kg)  01/07/14 204 lb 9.6 oz (92.806 kg)    Physical Exam  Constitutional: She is oriented to person, place, and time. She appears well-developed and well-nourished. No distress.  HENT:  Right  Ear: Tympanic membrane, external ear and ear canal normal.  Left Ear: Tympanic membrane, external ear and ear canal normal.  Nose: Mucosal edema and rhinorrhea present. No epistaxis. Right sinus exhibits no maxillary sinus tenderness and no frontal sinus tenderness. Left sinus exhibits no maxillary sinus tenderness and no frontal sinus tenderness.  Mouth/Throat: Uvula is midline and mucous membranes are normal. Posterior oropharyngeal edema and posterior oropharyngeal erythema present. No oropharyngeal exudate or tonsillar abscesses.  Eyes: Conjunctivae and EOM are normal.  Cardiovascular: Normal rate, regular rhythm, normal heart sounds and intact distal pulses.    No murmur heard. Pulmonary/Chest: Effort normal. No respiratory distress. She has wheezes (End expiratory wheezes minimally in upper lobes). She has no rales. She exhibits tenderness (Posterior left chest wall tenderness along the inferior ribs under the scapula.).  Musculoskeletal: Normal range of motion. She exhibits no edema or tenderness.  Neurological: She is alert and oriented to person, place, and time. Coordination normal.  Skin: Skin is warm and dry. No rash noted. She is not diaphoretic.  Psychiatric: She has a normal mood and affect. Her behavior is normal.  Vitals reviewed.  Chest x-ray: No acute cardiopulmonary processes are noted. Await final read by radiologist.    Assessment & Plan:   Problem List Items Addressed This Visit    None    Visit Diagnoses    Screening for malignant neoplasm    -  Primary    Relevant Medications    montelukast (SINGULAIR) 10 MG tablet    Other Relevant Orders    Ambulatory referral to Gastroenterology    Wheezing        Relevant Medications    montelukast (SINGULAIR) 10 MG tablet    Other Relevant Orders    DG Chest 2 View (Completed)        Follow up plan: Return in about 4 weeks (around 04/26/2015), or if symptoms worsen or fail to improve.  Counseling provided for all of the vaccine components Orders Placed This Encounter  Procedures  . DG Chest 2 View  . Ambulatory referral to Gastroenterology    Caryl Pina, MD [facility] 03/30/2015, 1:58 PM

## 2015-03-31 ENCOUNTER — Ambulatory Visit: Payer: BLUE CROSS/BLUE SHIELD | Admitting: Family Medicine

## 2015-05-23 ENCOUNTER — Encounter: Payer: BLUE CROSS/BLUE SHIELD | Admitting: Internal Medicine

## 2015-07-18 IMAGING — MG MM DIAG BREAST TOMO BILATERAL
6 of 9 series · 6 of 25 positions shown · non-contrast
Comparison: Priors

CLINICAL DATA: Diffuse left breast pain

EXAM:
DIGITAL DIAGNOSTIC BILATERAL MAMMOGRAM WITH 3D TOMOSYNTHESIS AND CAD

[R CC (1 of 2)]
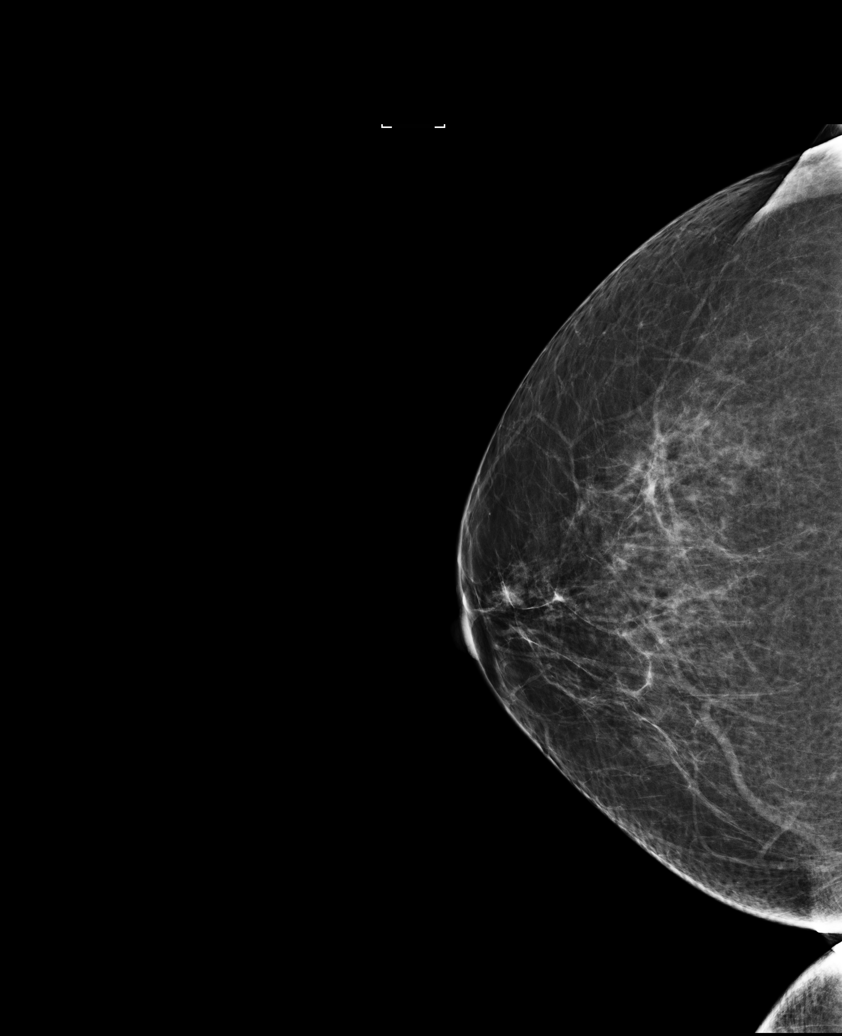

[R CC (2 of 2)]
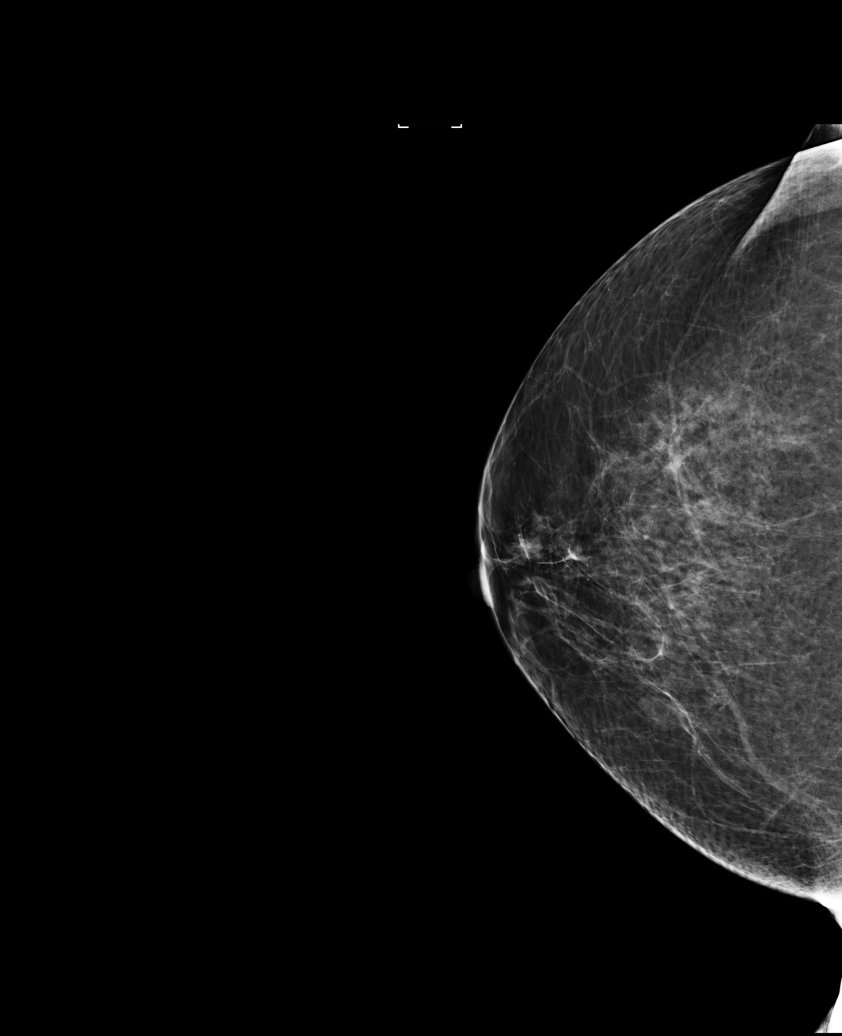

[L CC]
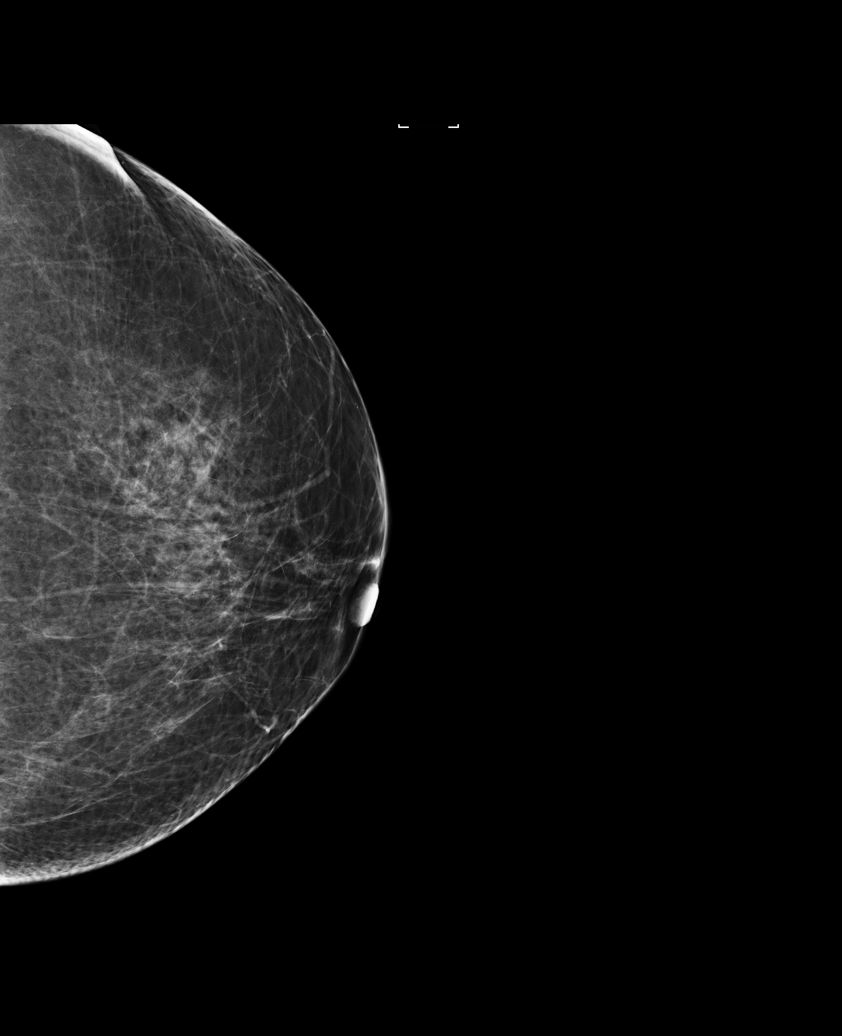

[L MLO]
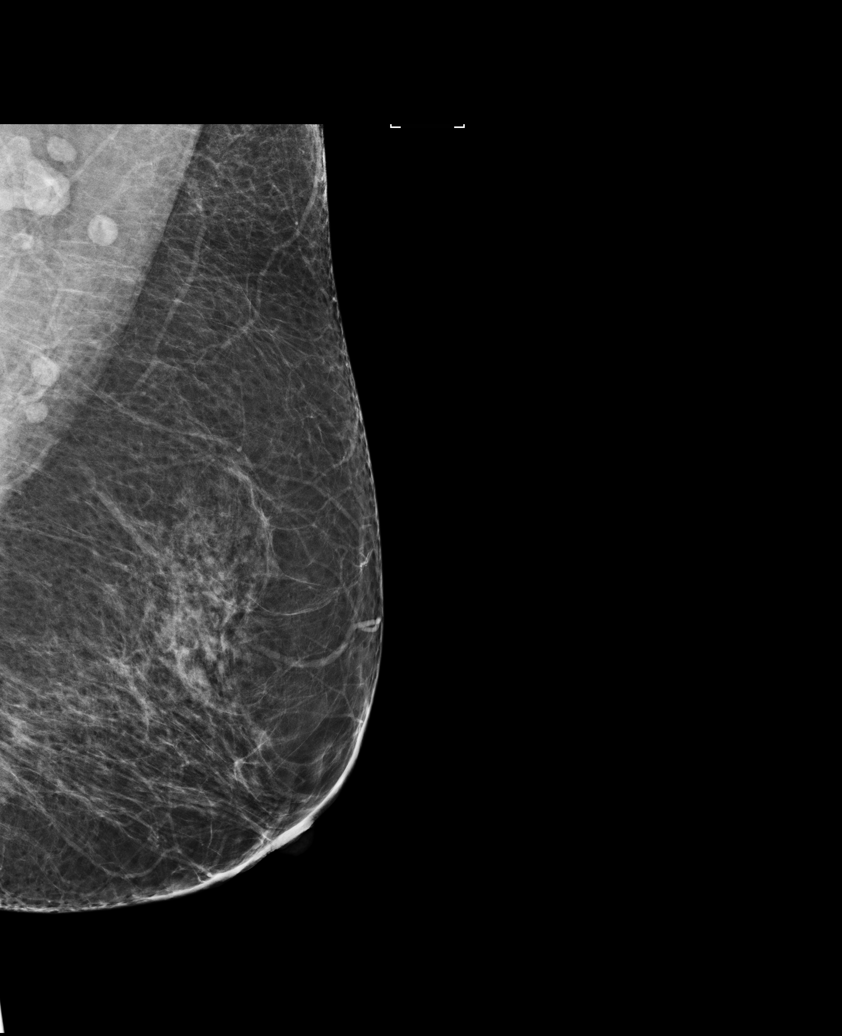

[R MLO]
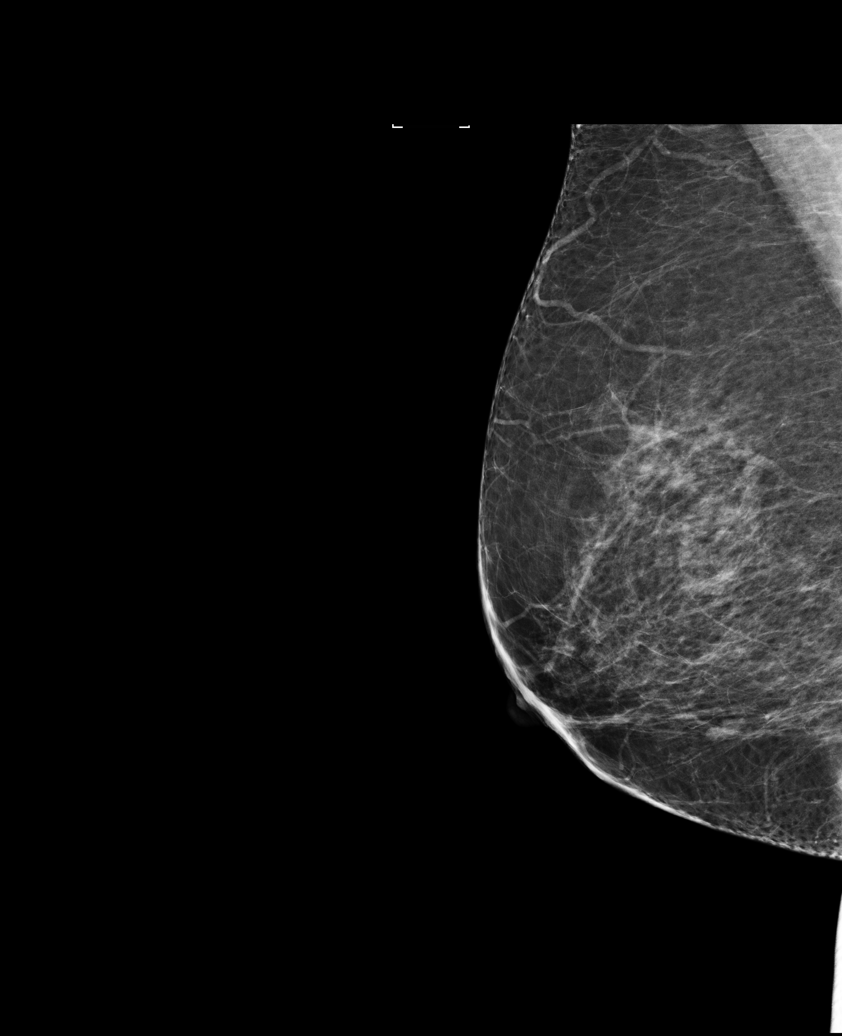

[R MLO tomo · tomo slice 34/67.0]
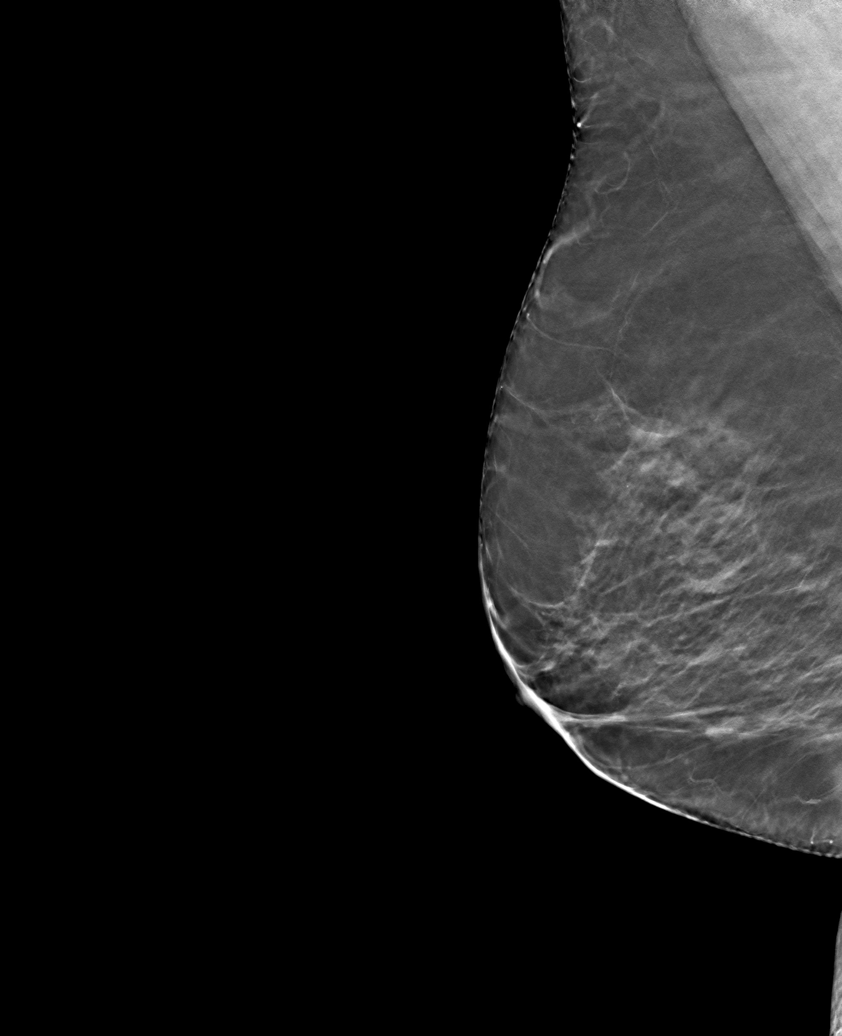

[6 of 25 positions shown; findings below may reference images not displayed]

ACR Breast Density Category b: There are scattered areas of
fibroglandular density.
FINDINGS: No evidence for malignancy is identified in either breast.

Mammographic images were processed with CAD.
IMPRESSION: No evidence for malignancy.

RECOMMENDATION:
Screening mammogram in one year.(Code:L5-[id number])

I have discussed the findings and recommendations with the patient.
Results were also provided in writing at the conclusion of the
visit. If applicable, a reminder letter will be sent to the patient
regarding the next appointment.

BI-RADS CATEGORY  1: Negative.

## 2015-09-04 ENCOUNTER — Other Ambulatory Visit: Payer: Self-pay | Admitting: Family

## 2015-09-05 NOTE — Telephone Encounter (Signed)
Last seen 03/29/15  Dr [name]

## 2015-10-11 ENCOUNTER — Ambulatory Visit: Payer: BLUE CROSS/BLUE SHIELD

## 2015-10-11 ENCOUNTER — Ambulatory Visit (INDEPENDENT_AMBULATORY_CARE_PROVIDER_SITE_OTHER): Payer: Managed Care, Other (non HMO) | Admitting: Family Medicine

## 2015-10-11 ENCOUNTER — Encounter: Payer: Self-pay | Admitting: Family Medicine

## 2015-10-11 VITALS — BP 134/82 | HR 101 | Temp 98.2°F | Ht 63.0 in | Wt 210.2 lb

## 2015-10-11 DIAGNOSIS — F329 Major depressive disorder, single episode, unspecified: Secondary | ICD-10-CM

## 2015-10-11 DIAGNOSIS — J0110 Acute frontal sinusitis, unspecified: Secondary | ICD-10-CM

## 2015-10-11 DIAGNOSIS — F32A Depression, unspecified: Secondary | ICD-10-CM

## 2015-10-11 DIAGNOSIS — J4521 Mild intermittent asthma with (acute) exacerbation: Secondary | ICD-10-CM | POA: Diagnosis not present

## 2015-10-11 MED ORDER — PREDNISONE 20 MG PO TABS: ORAL_TABLET | ORAL | 0 refills | Status: DC

## 2015-10-11 MED ORDER — AZITHROMYCIN 250 MG PO TABS: ORAL_TABLET | ORAL | 0 refills | Status: DC

## 2015-10-11 MED ORDER — FLUTICASONE PROPIONATE 50 MCG/ACT NA SUSP: 1.0000 | Freq: Two times a day (BID) | NASAL | 6 refills | Status: DC | PRN

## 2015-10-11 MED ORDER — SERTRALINE HCL 50 MG PO TABS: 50.0000 mg | ORAL_TABLET | Freq: Every day | ORAL | 3 refills | Status: DC

## 2015-10-11 MED ORDER — FLUTICASONE FUROATE-VILANTEROL 100-25 MCG/INH IN AEPB: 1.0000 | INHALATION_SPRAY | Freq: Every day | RESPIRATORY_TRACT | 2 refills | Status: DC

## 2015-10-11 NOTE — Progress Notes (Signed)
BP 134/82 (BP Location: Right Arm, Patient Position: Sitting, Cuff Size: Large)   Pulse (!) 101   Temp 98.2 F (36.8 C) (Oral)   Ht 5\' 3"  (1.6 m)   Wt 210 lb 3.2 oz (95.3 kg)   LMP 05/04/2010   BMI 37.24 kg/m    Subjective:    Patient ID: Desiree Cooper, female    DOB: [birth year], 55 y.o.   MRN: [id number]  HPI: Desiree Cooper is a 55 y.o. female presenting on 10/11/2015 for Sinusitis (x 1 week, low grade fever, drainage into chest, watery eyes, congestion); discuss inhalers (opinion of symbicort vs breo); and Discuss Wellbutrin (has not been taking for about a month, did not feel it was helping)   HPI Sinus infection and asthma issues. Patient comes in today because she's been having some sinus congestion and laryngitis and sore throat and postnasal drainage and some pressure in ears. This is been going on for one week and she's been trying to use some home remedies of nasal saline washes and Tylenol Sinus and cold. She denies any fevers or chills or shortness of breath. She has had a little wheeze but not too much. The Symbicort has been helping her some. She would like to try going on Breo Ellipta because her mother is on it.  Depression Patient has known depression and had been on Wellbutrin and just felt like it was not helping her despite changing doses around. She would like to try something different. She denies any suicidal ideations or thoughts of hurting herself. She does have issues sleeping at night. She does have issues with sadness and feeling helpless and hopeless. Her energy also is down sometimes.  Relevant past medical, surgical, family and social history reviewed and updated as indicated. Interim medical history since our last visit reviewed. Allergies and medications reviewed and updated.  Review of Systems  Constitutional: Negative for chills and fever.  HENT: Positive for congestion, postnasal drip, rhinorrhea, sinus pressure, sneezing, sore throat and voice change.  Negative for ear discharge and ear pain.   Eyes: Negative for pain, redness and visual disturbance.  Respiratory: Positive for cough. Negative for chest tightness, shortness of breath and wheezing.   Cardiovascular: Negative for chest pain and leg swelling.  Genitourinary: Negative for difficulty urinating and dysuria.  Musculoskeletal: Negative for back pain and gait problem.  Skin: Negative for rash.  Neurological: Negative for light-headedness and headaches.  Psychiatric/Behavioral: Positive for dysphoric mood and sleep disturbance. Negative for agitation, behavioral problems, decreased concentration, self-injury and suicidal ideas. The patient is not nervous/anxious.   All other systems reviewed and are negative.   Per HPI unless specifically indicated above     Medication List       Accurate as of 10/11/15 11:56 AM. Always use your most recent med list.          azithromycin 250 MG tablet Commonly known as:  ZITHROMAX Take 2 the first day and then one each day after.   budesonide-formoterol 160-4.5 MCG/ACT inhaler Commonly known as:  SYMBICORT Inhale 2 puffs into the lungs 2 (two) times daily.   fluticasone 50 MCG/ACT nasal spray Commonly known as:  FLONASE Place 1 spray into both nostrils 2 (two) times daily as needed for allergies or rhinitis.   fluticasone furoate-vilanterol 100-25 MCG/INH Aepb Commonly known as:  BREO ELLIPTA Inhale 1 puff into the lungs daily.   predniSONE 20 MG tablet Commonly known as:  DELTASONE 2 po at same time daily for 5 days  sertraline 50 MG tablet Commonly known as:  ZOLOFT Take 1 tablet (50 mg total) by mouth daily.          Objective:    BP 134/82 (BP Location: Right Arm, Patient Position: Sitting, Cuff Size: Large)   Pulse (!) 101   Temp 98.2 F (36.8 C) (Oral)   Ht 5\' 3"  (1.6 m)   Wt 210 lb 3.2 oz (95.3 kg)   LMP 05/04/2010   BMI 37.24 kg/m   Wt Readings from Last 3 Encounters:  10/11/15 210 lb 3.2 oz (95.3 kg)    03/29/15 207 lb (93.9 kg)  08/23/14 194 lb 9.6 oz (88.3 kg)    Physical Exam  Constitutional: She is oriented to person, place, and time. She appears well-developed and well-nourished. No distress.  HENT:  Right Ear: Tympanic membrane, external ear and ear canal normal.  Left Ear: Tympanic membrane, external ear and ear canal normal.  Nose: Mucosal edema and rhinorrhea present. No epistaxis. Right sinus exhibits no maxillary sinus tenderness and no frontal sinus tenderness. Left sinus exhibits no maxillary sinus tenderness and no frontal sinus tenderness.  Mouth/Throat: Uvula is midline and mucous membranes are normal. Posterior oropharyngeal edema and posterior oropharyngeal erythema present. No oropharyngeal exudate or tonsillar abscesses.  Eyes: Conjunctivae and EOM are normal.  Cardiovascular: Normal rate, regular rhythm, normal heart sounds and intact distal pulses.   No murmur heard. Pulmonary/Chest: Effort normal and breath sounds normal. No respiratory distress. She has no wheezes.  Musculoskeletal: Normal range of motion. She exhibits no edema or tenderness.  Neurological: She is alert and oriented to person, place, and time. Coordination normal.  Skin: Skin is warm and dry. No rash noted. She is not diaphoretic.  Psychiatric: Her behavior is normal. Her mood appears not anxious. She exhibits a depressed mood. She expresses no suicidal ideation. She expresses no suicidal plans.  Vitals reviewed.     Assessment & Plan:   Problem List Items Addressed This Visit      Other   Depression - Primary   Relevant Medications   sertraline (ZOLOFT) 50 MG tablet    Other Visit Diagnoses    Asthma with acute exacerbation, mild intermittent       Relevant Medications   fluticasone furoate-vilanterol (BREO ELLIPTA) 100-25 MCG/INH AEPB   predniSONE (DELTASONE) 20 MG tablet   Acute frontal sinusitis, recurrence not specified       Relevant Medications   predniSONE (DELTASONE) 20 MG  tablet   fluticasone (FLONASE) 50 MCG/ACT nasal spray   azithromycin (ZITHROMAX) 250 MG tablet       Follow up plan: Return in about 4 weeks (around 11/08/2015), or if symptoms worsen or fail to improve, for Depression recheck.  Counseling provided for all of the vaccine components No orders of the defined types were placed in this encounter.   Caryl Pina, MD [facility] 10/11/2015, 11:56 AM

## 2015-10-28 ENCOUNTER — Ambulatory Visit (INDEPENDENT_AMBULATORY_CARE_PROVIDER_SITE_OTHER): Payer: Managed Care, Other (non HMO) | Admitting: Nurse Practitioner

## 2015-10-28 ENCOUNTER — Encounter: Payer: Self-pay | Admitting: Nurse Practitioner

## 2015-10-28 VITALS — BP 131/85 | HR 70 | Temp 97.9°F | Ht 63.0 in | Wt 214.0 lb

## 2015-10-28 DIAGNOSIS — B9789 Other viral agents as the cause of diseases classified elsewhere: Secondary | ICD-10-CM

## 2015-10-28 DIAGNOSIS — J029 Acute pharyngitis, unspecified: Secondary | ICD-10-CM | POA: Diagnosis not present

## 2015-10-28 DIAGNOSIS — J028 Acute pharyngitis due to other specified organisms: Secondary | ICD-10-CM

## 2015-10-28 NOTE — Progress Notes (Signed)
Desiree Cooper is a 55 y.o. female presenting with a sore throat for 7 days.  Associated symptoms include:  chills, headache, runny nose and fatigue.  Symptoms are constant.  Home treatment thus far includes:  rest, hydration and She was seen 2 weeks ago with sinus infection and was given a zpak  and prednisone..  No known sick contacts with similar symptoms.  There is no history of of similar symptoms.  ROS: No fatigue, or fever Ears normal No cough,or congestion, positive for sore throat No palpitations No SOB No rash  Exam:  BP 131/85   Pulse 70   Temp 97.9 F (36.6 C) (Oral)   Ht 5\' 3"  (1.6 m)   Wt 214 lb (97.1 kg)   LMP 05/04/2010   BMI 37.91 kg/m  Constitutional - alert and oriiented HEENT - TM'S normal bil,post oral pharynxs erythematous and edematous bil Neck SUPPLE Heart RRR no MGR Lungs  Clear in all fields Skin warm and dry, no rash  Strep negative  Viral pharyngitis  Force fluids Motrin or tylenol OTC OTC decongestant Throat lozenges if help New toothbrush in 3 days  Mary-Margaret Hassell Done, FNP

## 2015-10-28 NOTE — Patient Instructions (Addendum)

## 2015-10-30 LAB — RAPID STREP SCREEN (MED CTR MEBANE ONLY): Strep Gp A Ag, IA W/Reflex: NEGATIVE

## 2015-10-30 LAB — CULTURE, GROUP A STREP

## 2015-11-08 ENCOUNTER — Ambulatory Visit: Payer: Managed Care, Other (non HMO)

## 2015-11-08 ENCOUNTER — Ambulatory Visit (INDEPENDENT_AMBULATORY_CARE_PROVIDER_SITE_OTHER): Payer: Managed Care, Other (non HMO) | Admitting: Family Medicine

## 2015-11-08 ENCOUNTER — Encounter: Payer: Self-pay | Admitting: Family Medicine

## 2015-11-08 VITALS — BP 138/83 | HR 70 | Temp 98.1°F | Ht 63.0 in | Wt 214.0 lb

## 2015-11-08 DIAGNOSIS — K2100 Gastro-esophageal reflux disease with esophagitis, without bleeding: Secondary | ICD-10-CM

## 2015-11-08 DIAGNOSIS — J4530 Mild persistent asthma, uncomplicated: Secondary | ICD-10-CM | POA: Diagnosis not present

## 2015-11-08 MED ORDER — BUDESONIDE-FORMOTEROL FUMARATE 160-4.5 MCG/ACT IN AERO: 2.0000 | INHALATION_SPRAY | Freq: Two times a day (BID) | RESPIRATORY_TRACT | 3 refills | Status: DC

## 2015-11-08 MED ORDER — GI COCKTAIL ~~LOC~~
30.0000 mL | Freq: Once | ORAL | Status: AC
  Administered 2015-11-08: 30 mL via ORAL

## 2015-11-08 NOTE — Progress Notes (Signed)
BP 138/83   Pulse 70   Temp 98.1 F (36.7 C) (Oral)   Ht 5\' 3"  (1.6 m)   Wt 214 lb (97.1 kg)   LMP 05/04/2010   BMI 37.91 kg/m    Subjective:    Patient ID: Desiree Cooper, female    DOB: 1960/11/01, 55 y.o.   MRN: [id number]  HPI: Desiree Cooper is a 55 y.o. female presenting on 11/08/2015 for Referral to ENT (patient has had severe sore throat since 10/11/2015, was seen here on that date and treated for sinusitis and seen again on 10/28/15 tested for strep negative ,no culture done at that time.  ); Insomnia (patient has had insomnia for the last 2 months, has tried OTC Melatonin but it does not seem to be helping.  Also tried Unisom and it made her too sleepy the next day); and Discuss Breo (patient would like to switch back to Symbicort, does not like the powder)   HPI Sore throat Patient has sore throat and burning in her throat. She feels like it got worse after the prednisone and azithromycin. She is having some belching and some axis flatulence. She denies any bloating but she does now have a little bit of epigastric abdominal pain. She denies any fevers or chills or nasal congestion or sinus drainage. She says all of that cleared up from the previous. She is also here just last Saturday and was tested for strep throat and came back negative. She denies any heartburn. She denies any blood in her stool.  Relevant past medical, surgical, family and social history reviewed and updated as indicated. Interim medical history since our last visit reviewed. Allergies and medications reviewed and updated.  Review of Systems  Constitutional: Negative for chills and fever.  HENT: Positive for sore throat. Negative for congestion, ear discharge, ear pain, rhinorrhea, sinus pressure and sneezing.   Eyes: Negative for redness and visual disturbance.  Respiratory: Negative for chest tightness and shortness of breath.   Cardiovascular: Negative for chest pain and leg swelling.  Gastrointestinal:  Positive for abdominal pain. Negative for abdominal distention, constipation, diarrhea, nausea and vomiting.  Genitourinary: Negative for difficulty urinating and dysuria.  Musculoskeletal: Negative for back pain and gait problem.  Skin: Negative for rash.  Neurological: Negative for light-headedness and headaches.  Psychiatric/Behavioral: Negative for agitation and behavioral problems.  All other systems reviewed and are negative.   Per HPI unless specifically indicated above     Medication List       Accurate as of 11/08/15  5:13 PM. Always use your most recent med list.          budesonide-formoterol 160-4.5 MCG/ACT inhaler Commonly known as:  SYMBICORT Inhale 2 puffs into the lungs 2 (two) times daily.   fluticasone 50 MCG/ACT nasal spray Commonly known as:  FLONASE Place 1 spray into both nostrils 2 (two) times daily as needed for allergies or rhinitis.   sertraline 50 MG tablet Commonly known as:  ZOLOFT Take 1 tablet (50 mg total) by mouth daily.          Objective:    BP 138/83   Pulse 70   Temp 98.1 F (36.7 C) (Oral)   Ht 5\' 3"  (1.6 m)   Wt 214 lb (97.1 kg)   LMP 05/04/2010   BMI 37.91 kg/m   Wt Readings from Last 3 Encounters:  11/08/15 214 lb (97.1 kg)  10/28/15 214 lb (97.1 kg)  10/11/15 210 lb 3.2 oz (95.3 kg)  Physical Exam  Constitutional: She is oriented to person, place, and time. She appears well-developed and well-nourished. No distress.  HENT:  Right Ear: External ear normal.  Left Ear: External ear normal.  Nose: Nose normal.  Mouth/Throat: Oropharynx is clear and moist. No oropharyngeal exudate.  Eyes: Conjunctivae are normal.  Neck: Neck supple. No thyromegaly present.  Cardiovascular: Normal rate, regular rhythm, normal heart sounds and intact distal pulses.   No murmur heard. Pulmonary/Chest: Effort normal and breath sounds normal. No respiratory distress. She has no wheezes.  Abdominal: Soft. Bowel sounds are normal. She  exhibits no distension. There is tenderness (Mild epigastric tenderness). There is no rebound and no guarding.  Musculoskeletal: Normal range of motion. She exhibits no edema or tenderness.  Lymphadenopathy:    She has no cervical adenopathy.  Neurological: She is alert and oriented to person, place, and time. Coordination normal.  Skin: Skin is warm and dry. No rash noted. She is not diaphoretic.  Psychiatric: She has a normal mood and affect. Her behavior is normal.  Nursing note and vitals reviewed.     Assessment & Plan:   Problem List Items Addressed This Visit    None    Visit Diagnoses    Gastroesophageal reflux disease with esophagitis    -  Primary   Relevant Medications   gi cocktail (Maalox,Lidocaine,Donnatal)   Asthma, mild persistent, uncomplicated       Patient wants to switch back to Symbicort from [place]. Sent medication   Relevant Medications   budesonide-formoterol (SYMBICORT) 160-4.5 MCG/ACT inhaler       Follow up plan: Return if symptoms worsen or fail to improve.  Counseling provided for all of the vaccine components No orders of the defined types were placed in this encounter.   Caryl Pina, MD [facility] 11/08/2015, 5:13 PM

## 2015-11-13 ENCOUNTER — Telehealth: Payer: Self-pay | Admitting: Family Medicine

## 2015-11-13 ENCOUNTER — Other Ambulatory Visit: Payer: Self-pay | Admitting: *Deleted

## 2015-11-13 DIAGNOSIS — J029 Acute pharyngitis, unspecified: Secondary | ICD-10-CM

## 2015-11-13 NOTE — Telephone Encounter (Signed)
Go ahead and do the referral to ENT for her, diagnosis pharyngitis persistent

## 2015-11-23 NOTE — Telephone Encounter (Signed)
Patient referred to ENT appt was 09/27

## 2015-12-21 ENCOUNTER — Ambulatory Visit (INDEPENDENT_AMBULATORY_CARE_PROVIDER_SITE_OTHER): Payer: BLUE CROSS/BLUE SHIELD | Admitting: Otolaryngology

## 2016-02-02 ENCOUNTER — Other Ambulatory Visit: Payer: Self-pay | Admitting: Family Medicine

## 2016-02-02 DIAGNOSIS — F32A Depression, unspecified: Secondary | ICD-10-CM

## 2016-02-02 DIAGNOSIS — F329 Major depressive disorder, single episode, unspecified: Secondary | ICD-10-CM

## 2016-02-06 ENCOUNTER — Other Ambulatory Visit: Payer: Self-pay | Admitting: *Deleted

## 2016-02-06 ENCOUNTER — Other Ambulatory Visit: Payer: Self-pay

## 2016-02-06 DIAGNOSIS — F329 Major depressive disorder, single episode, unspecified: Secondary | ICD-10-CM

## 2016-02-06 DIAGNOSIS — F32A Depression, unspecified: Secondary | ICD-10-CM

## 2016-02-06 MED ORDER — SERTRALINE HCL 50 MG PO TABS: 50.0000 mg | ORAL_TABLET | Freq: Every day | ORAL | 0 refills | Status: DC

## 2016-05-13 ENCOUNTER — Ambulatory Visit (INDEPENDENT_AMBULATORY_CARE_PROVIDER_SITE_OTHER): Payer: Managed Care, Other (non HMO) | Admitting: Otolaryngology

## 2016-05-13 DIAGNOSIS — J310 Chronic rhinitis: Secondary | ICD-10-CM

## 2016-05-13 DIAGNOSIS — J342 Deviated nasal septum: Secondary | ICD-10-CM | POA: Diagnosis not present

## 2016-05-13 DIAGNOSIS — J320 Chronic maxillary sinusitis: Secondary | ICD-10-CM

## 2016-06-11 ENCOUNTER — Other Ambulatory Visit: Payer: Self-pay | Admitting: Family Medicine

## 2016-07-30 ENCOUNTER — Ambulatory Visit (INDEPENDENT_AMBULATORY_CARE_PROVIDER_SITE_OTHER): Payer: No Typology Code available for payment source | Admitting: Family

## 2016-07-30 ENCOUNTER — Ambulatory Visit: Payer: Managed Care, Other (non HMO)

## 2016-07-30 ENCOUNTER — Encounter: Payer: Self-pay | Admitting: Family

## 2016-07-30 VITALS — BP 120/83 | HR 97 | Temp 100.9°F | Ht 63.0 in | Wt 211.0 lb

## 2016-07-30 DIAGNOSIS — R509 Fever, unspecified: Secondary | ICD-10-CM

## 2016-07-30 DIAGNOSIS — J4540 Moderate persistent asthma, uncomplicated: Secondary | ICD-10-CM

## 2016-07-30 DIAGNOSIS — J209 Acute bronchitis, unspecified: Secondary | ICD-10-CM

## 2016-07-30 DIAGNOSIS — J0100 Acute maxillary sinusitis, unspecified: Secondary | ICD-10-CM | POA: Diagnosis not present

## 2016-07-30 MED ORDER — PREDNISONE 10 MG (21) PO TBPK: ORAL_TABLET | ORAL | 0 refills | Status: DC

## 2016-07-30 MED ORDER — LEVOFLOXACIN 500 MG PO TABS: 500.0000 mg | ORAL_TABLET | Freq: Every day | ORAL | 0 refills | Status: DC

## 2016-07-30 NOTE — Patient Instructions (Signed)

## 2016-07-30 NOTE — Progress Notes (Signed)
Subjective:    Patient ID: Desiree Cooper, female    DOB: 1960-05-11, 56 y.o.   MRN: [id number]  Sinus Problem  The current episode started in the past 7 days. The problem has been rapidly worsening since onset. The maximum temperature recorded prior to her arrival was 100.4 - 100.9 F. Her pain is at a severity of 8/10. Associated symptoms include chills, congestion, coughing, headaches, a hoarse voice, neck pain, sinus pressure, a sore throat and swollen glands. Pertinent negatives include no ear pain or sneezing. Past treatments include oral decongestants, spray decongestants and acetaminophen. The treatment provided mild relief.  Fever   Associated symptoms include congestion, coughing, headaches and a sore throat. Pertinent negatives include no ear pain.      Review of Systems  Constitutional: Positive for chills and fever.  HENT: Positive for congestion, hoarse voice, sinus pressure and sore throat. Negative for ear pain and sneezing.   Respiratory: Positive for cough.   Musculoskeletal: Positive for neck pain.  Neurological: Positive for headaches.  All other systems reviewed and are negative.      Objective:   Physical Exam  Constitutional: She is oriented to person, place, and time. She appears well-developed and well-nourished. No distress.  HENT:  Head: Normocephalic and atraumatic.  Right Ear: External ear normal.  Nose: Mucosal edema and rhinorrhea present. Right sinus exhibits maxillary sinus tenderness and frontal sinus tenderness. Left sinus exhibits maxillary sinus tenderness and frontal sinus tenderness.  Mouth/Throat: Posterior oropharyngeal erythema present.  Eyes: Pupils are equal, round, and reactive to light.  Neck: Normal range of motion. Neck supple. No thyromegaly present.  Cardiovascular: Normal rate, regular rhythm, normal heart sounds and intact distal pulses.   No murmur heard. Pulmonary/Chest: Effort normal. No respiratory distress. She has wheezes.    Abdominal: Soft. Bowel sounds are normal. She exhibits no distension. There is no tenderness.  Musculoskeletal: Normal range of motion. She exhibits no edema or tenderness.  Neurological: She is alert and oriented to person, place, and time. She has normal reflexes. No cranial nerve deficit.  Skin: Skin is warm and dry.  Psychiatric: She has a normal mood and affect. Her behavior is normal. Judgment and thought content normal.  Vitals reviewed.      BP 120/83   Pulse 97   Temp (!) 100.9 F (38.3 C) (Oral)   Ht 5\' 3"  (1.6 m)   Wt 211 lb (95.7 kg)   LMP 05/04/2010   BMI 37.38 kg/m      Assessment & Plan:  1. Moderate persistent chronic asthma without complication - predniSONE (STERAPRED UNI-PAK 21 TAB) 10 MG (21) TBPK tablet; Use as directed  Dispense: 21 tablet; Refill: 0  2. Acute maxillary sinusitis, recurrence not specified - Take meds as prescribed - Use a cool mist humidifier  -Use saline nose sprays frequently -Saline irrigations of the nose can be very helpful if done frequently.  * 4X daily for 1 week*  * Use of a nettie pot can be helpful with this. Follow directions with this* -Force fluids -For any cough or congestion  Use plain Mucinex- regular strength or max strength is fine   * Children- consult with Pharmacist for dosing -For fever or aces or pains- take tylenol or ibuprofen appropriate for age and weight.  * for fevers greater than 101 orally you may alternate ibuprofen and tylenol every  3 hours. -Throat lozenges if help -New toothbrush in 3 days - predniSONE (STERAPRED UNI-PAK 21 TAB) 10 MG (21) TBPK  tablet; Use as directed  Dispense: 21 tablet; Refill: 0 - levofloxacin (LEVAQUIN) 500 MG tablet; Take 1 tablet (500 mg total) by mouth daily.  Dispense: 7 tablet; Refill: 0  3. Acute bronchitis, unspecified organism - predniSONE (STERAPRED UNI-PAK 21 TAB) 10 MG (21) TBPK tablet; Use as directed  Dispense: 21 tablet; Refill: 0 - levofloxacin (LEVAQUIN) 500  MG tablet; Take 1 tablet (500 mg total) by mouth daily.  Dispense: 7 tablet; Refill: 0  4. Fever, unspecified fever cause  [name], FNP

## 2016-08-02 ENCOUNTER — Telehealth: Payer: Self-pay | Admitting: Family Medicine

## 2016-08-02 MED ORDER — ALBUTEROL SULFATE (2.5 MG/3ML) 0.083% IN NEBU: 2.5000 mg | INHALATION_SOLUTION | Freq: Four times a day (QID) | RESPIRATORY_TRACT | 1 refills | Status: DC | PRN

## 2016-08-02 MED ORDER — IPRATROPIUM BROMIDE 0.02 % IN SOLN: 0.5000 mg | Freq: Four times a day (QID) | RESPIRATORY_TRACT | 12 refills | Status: DC

## 2016-08-02 NOTE — Addendum Note (Signed)
Addended by: [name] A on: 08/02/2016 12:38 PM   Modules accepted: Orders

## 2016-08-02 NOTE — Telephone Encounter (Signed)
Albuterol nebulizer solution Prescription sent to pharmacy. She already have the machine?

## 2016-08-02 NOTE — Telephone Encounter (Signed)
Pt requesting nebulizer solution to help break up congestion Please advise

## 2016-08-02 NOTE — Telephone Encounter (Signed)
Prescription sent to pharmacy.

## 2016-08-02 NOTE — Telephone Encounter (Signed)
Pt does not want albuterol, requesting Ipratropium Sorry I didn't add that to the note

## 2016-08-15 IMAGING — CR DG CHEST 2V
2 series · 2 of 2 positions shown · non-contrast
Comparison: Chest x-ray of 07/26/2009

CLINICAL DATA: Pain under the left arm, wheezing

EXAM:
CHEST  2 VIEW

[view not recorded (1 of 2)]
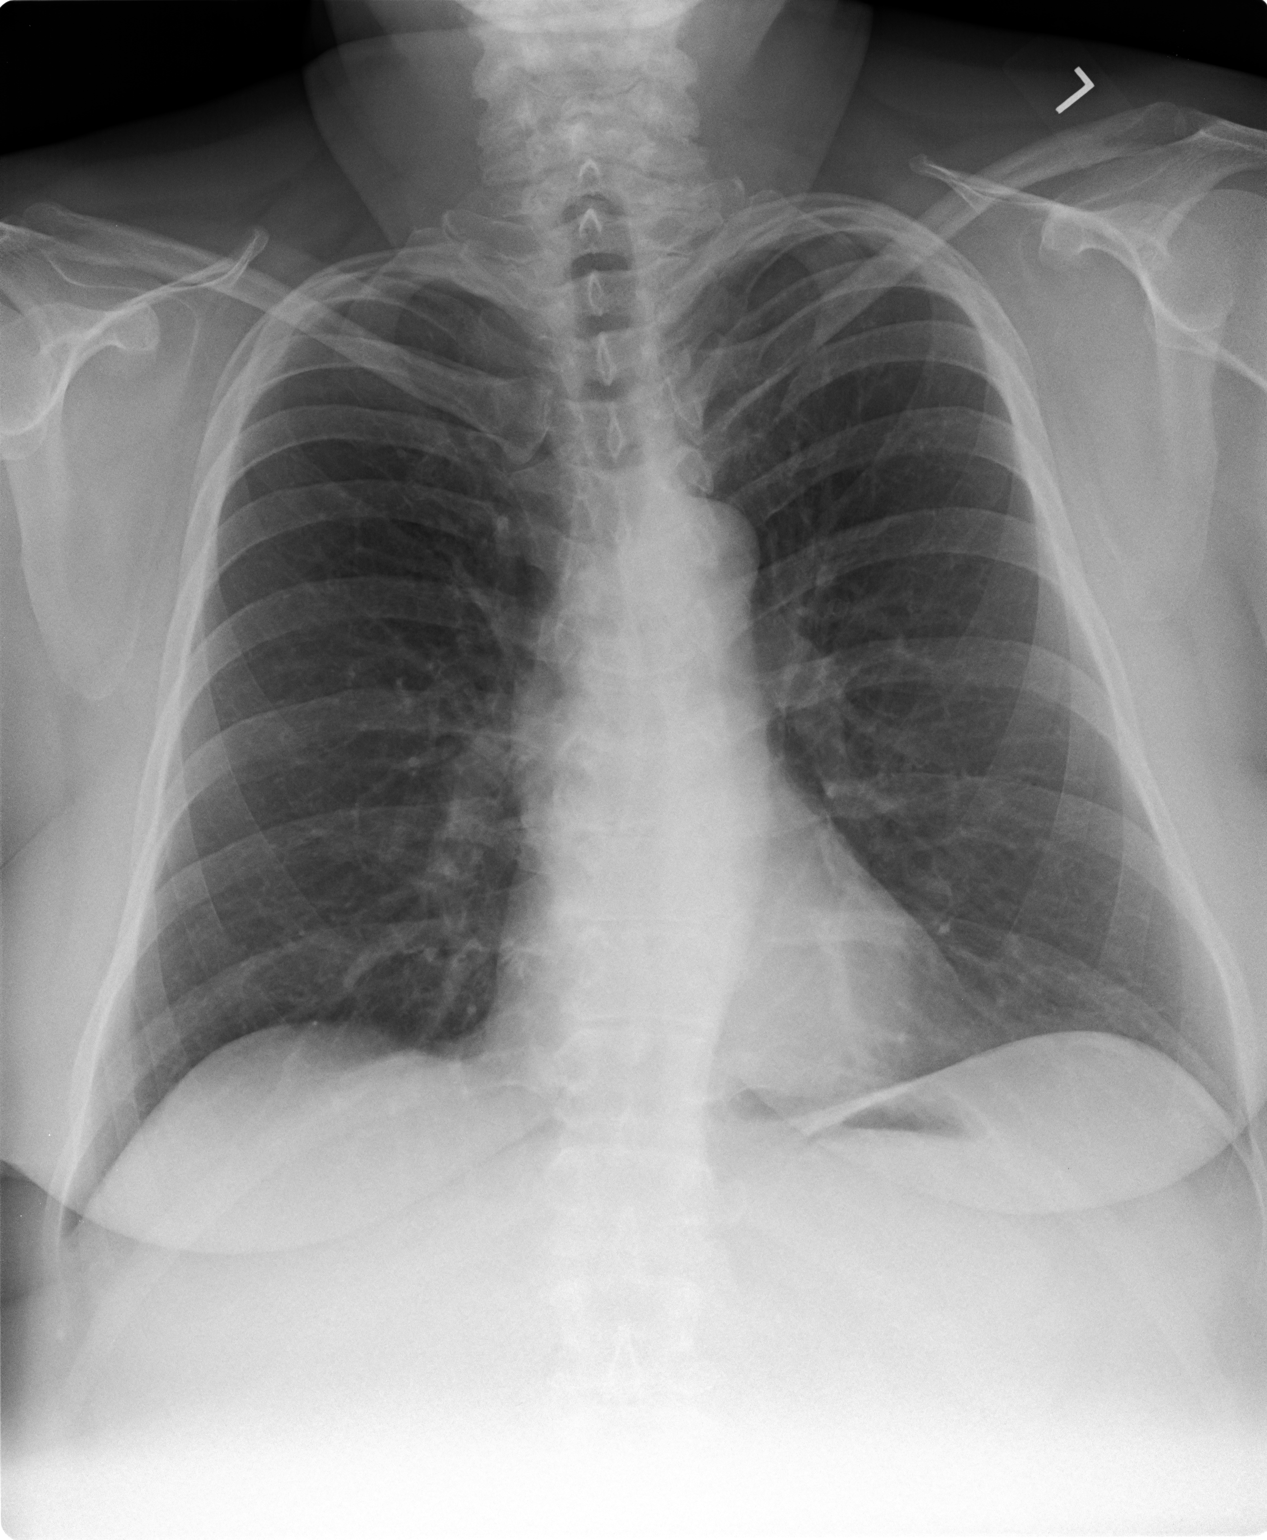

[view not recorded (2 of 2)]
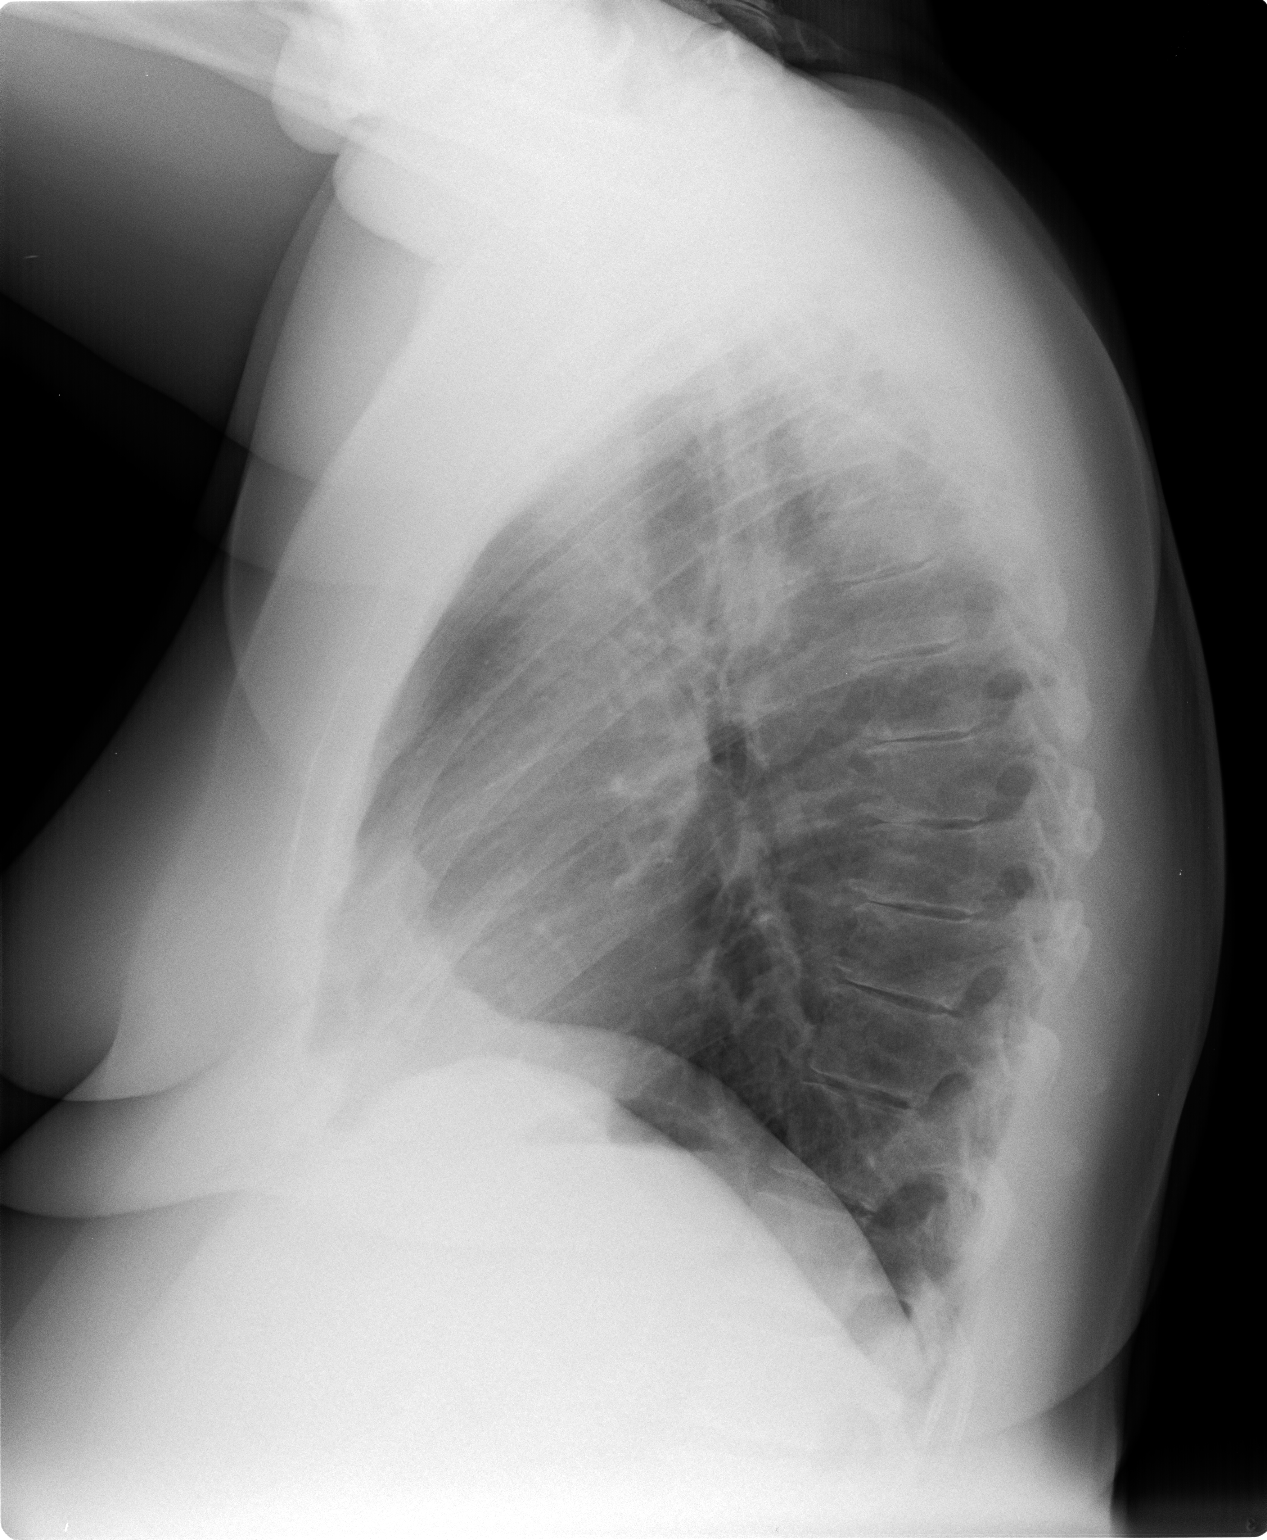

[2 of 2 positions shown; findings below may reference images not displayed]

FINDINGS: No active infiltrate or effusion is seen. Mediastinal and hilar
contours are unremarkable. The heart is within normal limits in
size. No acute bony abnormality is seen. There are mild degenerative
changes in the mid to lower thoracic spine.
IMPRESSION: No active cardiopulmonary disease.

## 2016-09-07 ENCOUNTER — Encounter: Payer: Self-pay | Admitting: Family Medicine

## 2016-09-07 ENCOUNTER — Ambulatory Visit (INDEPENDENT_AMBULATORY_CARE_PROVIDER_SITE_OTHER): Payer: No Typology Code available for payment source | Admitting: Family Medicine

## 2016-09-07 VITALS — BP 130/80 | HR 72 | Temp 97.3°F | Ht 63.0 in | Wt 217.0 lb

## 2016-09-07 DIAGNOSIS — G2581 Restless legs syndrome: Secondary | ICD-10-CM

## 2016-09-07 MED ORDER — PRAMIPEXOLE DIHYDROCHLORIDE 0.125 MG PO TABS: 0.1250 mg | ORAL_TABLET | Freq: Every day | ORAL | 2 refills | Status: DC

## 2016-09-07 NOTE — Progress Notes (Signed)
BP 130/80 (BP Location: Right Arm)   Pulse 72   Temp (!) 97.3 F (36.3 C) (Oral)   Ht 5\' 3"  (1.6 m)   Wt 217 lb (98.4 kg)   LMP 05/04/2010   BMI 38.44 kg/m    Subjective:    Patient ID: Desiree Cooper, female    DOB: 1960-08-25, 56 y.o.   MRN: [id number]  HPI: Desiree Cooper is a 56 y.o. female presenting on 09/07/2016 for restless leg   HPI Restless leg syndrome Patient has been having increased issues with restless leg. She has been dealing with it for years but over the past month it is just bothering her to the point that it's keeping her up at night and she is having get up frequently to walk around and move and as soon as she lays down she feels like her legs have to move some things inside of him that she cannot be still and cannot sleep well at night. The inability sleep is starting to take a toll on her and she is fatigued throughout the day. She denies any pain or issues while walking or during the day but just in the evenings when she lays down. She does admit that she snores some but does not know if she ever stops breathing.  Relevant past medical, surgical, family and social history reviewed and updated as indicated. Interim medical history since our last visit reviewed. Allergies and medications reviewed and updated.  Review of Systems  Constitutional: Negative for chills and fever.  HENT: Negative for congestion, ear discharge and ear pain.   Eyes: Negative for redness and visual disturbance.  Respiratory: Negative for chest tightness and shortness of breath.   Cardiovascular: Negative for chest pain and leg swelling.  Genitourinary: Negative for difficulty urinating and dysuria.  Musculoskeletal: Negative for back pain and gait problem.  Skin: Negative for rash.  Neurological: Negative for light-headedness and headaches.  Psychiatric/Behavioral: Positive for sleep disturbance. Negative for agitation, behavioral problems, decreased concentration and dysphoric mood.    All other systems reviewed and are negative.   Per HPI unless specifically indicated above        Objective:    BP 130/80 (BP Location: Right Arm)   Pulse 72   Temp (!) 97.3 F (36.3 C) (Oral)   Ht 5\' 3"  (1.6 m)   Wt 217 lb (98.4 kg)   LMP 05/04/2010   BMI 38.44 kg/m   Wt Readings from Last 3 Encounters:  09/07/16 217 lb (98.4 kg)  07/30/16 211 lb (95.7 kg)  11/08/15 214 lb (97.1 kg)    Physical Exam  Constitutional: She is oriented to person, place, and time. She appears well-developed and well-nourished. No distress.  Eyes: Conjunctivae are normal.  Cardiovascular: Normal rate, regular rhythm, normal heart sounds and intact distal pulses.   No murmur heard. Pulmonary/Chest: Effort normal and breath sounds normal. No respiratory distress. She has no wheezes. She has no rales.  Musculoskeletal: Normal range of motion. She exhibits no edema or tenderness (Tenderness or tenderness with range of motion of lower extremities on exam today).  Neurological: She is alert and oriented to person, place, and time. Coordination normal.  Skin: Skin is warm and dry. No rash noted. She is not diaphoretic.  Psychiatric: She has a normal mood and affect. Her behavior is normal.  Nursing note and vitals reviewed.       Assessment & Plan:   Problem List Items Addressed This Visit    None  Visit Diagnoses    Restless leg syndrome    -  Primary   Relevant Medications   pramipexole (MIRAPEX) 0.125 MG tablet   Other Relevant Orders   Ambulatory referral to Sleep Studies       Follow up plan: Return if symptoms worsen or fail to improve.  Counseling provided for all of the vaccine components Orders Placed This Encounter  Procedures  . Ambulatory referral to Sleep Studies    Caryl Pina, MD [facility] 09/07/2016, 8:26 AM

## 2016-09-07 NOTE — Patient Instructions (Signed)

## 2016-09-26 ENCOUNTER — Other Ambulatory Visit: Payer: Self-pay | Admitting: Family Medicine

## 2016-09-26 NOTE — Telephone Encounter (Signed)
Patient needs appt, we may give her enough to get to the appt date

## 2016-10-01 ENCOUNTER — Telehealth: Payer: Self-pay | Admitting: Family Medicine

## 2016-10-01 ENCOUNTER — Other Ambulatory Visit: Payer: Self-pay | Admitting: *Deleted

## 2016-10-01 DIAGNOSIS — R6882 Decreased libido: Secondary | ICD-10-CM | POA: Insufficient documentation

## 2016-10-01 DIAGNOSIS — N898 Other specified noninflammatory disorders of vagina: Secondary | ICD-10-CM | POA: Insufficient documentation

## 2016-10-01 DIAGNOSIS — R32 Unspecified urinary incontinence: Secondary | ICD-10-CM | POA: Insufficient documentation

## 2016-10-01 DIAGNOSIS — G43909 Migraine, unspecified, not intractable, without status migrainosus: Secondary | ICD-10-CM | POA: Insufficient documentation

## 2016-10-01 DIAGNOSIS — Z1211 Encounter for screening for malignant neoplasm of colon: Secondary | ICD-10-CM

## 2016-10-01 DIAGNOSIS — E669 Obesity, unspecified: Secondary | ICD-10-CM | POA: Insufficient documentation

## 2016-10-01 DIAGNOSIS — I10 Essential (primary) hypertension: Secondary | ICD-10-CM | POA: Insufficient documentation

## 2016-10-01 NOTE — Telephone Encounter (Signed)
Referral placed and patient aware 

## 2016-10-01 NOTE — Telephone Encounter (Signed)
What type of referral do you need? colonoscopy  Have you been seen at our office for this problem? yes (If no, schedule them an appointment.  They will need to be seen before a referral can be done.)  Is there a particular doctor or location that you prefer? [facility] on a Monday.  Patient notified that referrals can take up to a week or longer to process. If they haven't heard anything within a week they should call back and speak with the referral department.

## 2016-10-09 ENCOUNTER — Ambulatory Visit (AMBULATORY_SURGERY_CENTER): Payer: Self-pay | Admitting: *Deleted

## 2016-10-09 VITALS — Ht 63.0 in | Wt 216.0 lb

## 2016-10-09 DIAGNOSIS — Z1211 Encounter for screening for malignant neoplasm of colon: Secondary | ICD-10-CM

## 2016-10-09 NOTE — Progress Notes (Signed)
Patient denies any allergies to eggs or soy. Patient denies any problems with anesthesia/sedation. Patient denies any oxygen use at home and does not take any diet/weight loss medications. EMMI education assisgned to patient on colonoscopy, this was explained and instructions given to patient. 

## 2016-10-10 ENCOUNTER — Institutional Professional Consult (permissible substitution): Payer: No Typology Code available for payment source | Admitting: Neurology

## 2016-10-23 ENCOUNTER — Ambulatory Visit (AMBULATORY_SURGERY_CENTER): Payer: PRIVATE HEALTH INSURANCE | Admitting: Internal Medicine

## 2016-10-23 ENCOUNTER — Encounter: Payer: Self-pay | Admitting: Internal Medicine

## 2016-10-23 ENCOUNTER — Other Ambulatory Visit: Payer: Self-pay | Admitting: Internal Medicine

## 2016-10-23 VITALS — BP 144/81 | HR 58 | Temp 97.8°F | Resp 22 | Ht 63.0 in | Wt 216.0 lb

## 2016-10-23 DIAGNOSIS — Z1211 Encounter for screening for malignant neoplasm of colon: Secondary | ICD-10-CM | POA: Diagnosis present

## 2016-10-23 DIAGNOSIS — D123 Benign neoplasm of transverse colon: Secondary | ICD-10-CM

## 2016-10-23 MED ORDER — SODIUM CHLORIDE 0.9 % IV SOLN: 500.0000 mL | INTRAVENOUS | Status: DC

## 2016-10-23 NOTE — Progress Notes (Signed)
Called to room to assist during endoscopic procedure.  Patient ID and intended procedure confirmed with present staff. Received instructions for my participation in the procedure from the performing physician.  

## 2016-10-23 NOTE — Patient Instructions (Addendum)
I found and removed a tiny (90mm) polyp.   I will let you know pathology results and when to have another routine colonoscopy by mail and/or My Chart.  You also have a condition called diverticulosis - common and not usually a problem. Please read the handout provided.  I appreciate the opportunity to care for you. Gatha Mayer, MD, [facility]  Handout given ; Diverticulosis.  YOU HAD AN ENDOSCOPIC PROCEDURE TODAY AT [facility]:   Refer to the procedure report that was given to you for any specific questions about what was found during the examination.  If the procedure report does not answer your questions, please call your gastroenterologist to clarify.  If you requested that your care partner not be given the details of your procedure findings, then the procedure report has been included in a sealed envelope for you to review at your convenience later.  YOU SHOULD EXPECT: Some feelings of bloating in the abdomen. Passage of more gas than usual.  Walking can help get rid of the air that was put into your GI tract during the procedure and reduce the bloating. If you had a lower endoscopy (such as a colonoscopy or flexible sigmoidoscopy) you may notice spotting of blood in your stool or on the toilet paper. If you underwent a bowel prep for your procedure, you may not have a normal bowel movement for a few days.  Please Note:  You might notice some irritation and congestion in your nose or some drainage.  This is from the oxygen used during your procedure.  There is no need for concern and it should clear up in a day or so.  SYMPTOMS TO REPORT IMMEDIATELY:   Following lower endoscopy (colonoscopy or flexible sigmoidoscopy):  Excessive amounts of blood in the stool  Significant tenderness or worsening of abdominal pains  Swelling of the abdomen that is new, acute  Fever of 100F or higher   For urgent or emergent issues, a gastroenterologist can be reached at any hour  by calling [phone].   DIET:  We do recommend a small meal at first, but then you may proceed to your regular diet.  Drink plenty of fluids but you should avoid alcoholic beverages for 24 hours.  ACTIVITY:  You should plan to take it easy for the rest of today and you should NOT DRIVE or use heavy machinery until tomorrow (because of the sedation medicines used during the test).    FOLLOW UP: Our staff will call the number listed on your records the next business day following your procedure to check on you and address any questions or concerns that you may have regarding the information given to you following your procedure. If we do not reach you, we will leave a message.  However, if you are feeling well and you are not experiencing any problems, there is no need to return our call.  We will assume that you have returned to your regular daily activities without incident.  If any biopsies were taken you will be contacted by phone or by letter within the next 1-3 weeks.  Please call us at [phone] if you have not heard about the biopsies in 3 weeks.    SIGNATURES/CONFIDENTIALITY: You and/or your care partner have signed paperwork which will be entered into your electronic medical record.  These signatures attest to the fact that that the information above on your After Visit Summary has been reviewed and is understood.  Full  responsibility of the confidentiality of this discharge information lies with you and/or your care-partner.

## 2016-10-23 NOTE — Progress Notes (Signed)
To PACU, VSS. Report to [email or web address] 

## 2016-10-23 NOTE — Op Note (Signed)
[facility] Patient Name: Desiree Cooper Procedure Date: 10/23/2016 11:15 AM MRN: [id number] Endoscopist: Gatha Mayer , MD Age: 56 Referring MD:  Date of Birth: 1960/12/24 Gender: Female Account #: [id number] Procedure:                Colonoscopy Indications:              Screening for colorectal malignant neoplasm, This                            is the patient's first colonoscopy Medicines:                Propofol per Anesthesia, Monitored Anesthesia Care Procedure:                Pre-Anesthesia Assessment:                           - Prior to the procedure, a History and Physical                            was performed, and patient medications and                            allergies were reviewed. The patient's tolerance of                            previous anesthesia was also reviewed. The risks                            and benefits of the procedure and the sedation                            options and risks were discussed with the patient.                            All questions were answered, and informed consent                            was obtained. Prior Anticoagulants: The patient has                            taken no previous anticoagulant or antiplatelet                            agents. ASA Grade Assessment: II - A patient with                            mild systemic disease. After reviewing the risks                            and benefits, the patient was deemed in                            satisfactory condition to undergo the procedure.  After obtaining informed consent, the colonoscope                            was passed under direct vision. Throughout the                            procedure, the patient's blood pressure, pulse, and                            oxygen saturations were monitored continuously. The                            Model CF-HQ190L [id number]) scope was introduced                             through the anus and advanced to the the cecum,                            identified by appendiceal orifice and ileocecal                            valve. The colonoscopy was performed without                            difficulty. The patient tolerated the procedure                            well. The quality of the bowel preparation was                            good. The bowel preparation used was Miralax. The                            ileocecal valve, appendiceal orifice, and rectum                            were photographed. Scope In: 11:17:09 AM Scope Out: 11:32:55 AM Scope Withdrawal Time: 0 hours 13 minutes 33 seconds  Total Procedure Duration: 0 hours 15 minutes 46 seconds  Findings:                 The perianal and digital rectal examinations were                            normal.                           A 2 mm polyp was found in the transverse colon. The                            polyp was sessile. The polyp was removed with a                            cold snare. Resection and retrieval were complete.  Verification of patient identification for the                            specimen was done. Estimated blood loss was minimal.                           Multiple small and large-mouthed diverticula were                            found in the sigmoid colon.                           The exam was otherwise without abnormality on                            direct and retroflexion views. Complications:            No immediate complications. Estimated Blood Loss:     Estimated blood loss was minimal. Impression:               - One 2 mm polyp in the transverse colon, removed                            with a cold snare. Resected and retrieved.                           - Diverticulosis in the sigmoid colon.                           - The examination was otherwise normal on direct                            and retroflexion  views. Recommendation:           - Patient has a contact number available for                            emergencies. The signs and symptoms of potential                            delayed complications were discussed with the                            patient. Return to normal activities tomorrow.                            Written discharge instructions were provided to the                            patient.                           - Resume previous diet.                           - Continue present medications.                           -  Repeat colonoscopy is recommended. The                            colonoscopy date will be determined after pathology                            results from today's exam become available for                            review. Gatha Mayer, MD 10/23/2016 11:37:50 AM This report has been signed electronically.

## 2016-10-24 ENCOUNTER — Telehealth: Payer: Self-pay | Admitting: *Deleted

## 2016-10-24 NOTE — Telephone Encounter (Signed)
No answer, second call.  Left message to call if questions or concerns. 

## 2016-10-29 ENCOUNTER — Encounter: Payer: Self-pay | Admitting: Internal Medicine

## 2016-10-29 DIAGNOSIS — Z86010 Personal history of colonic polyps: Secondary | ICD-10-CM

## 2016-10-29 HISTORY — DX: Personal history of colonic polyps: Z86.010

## 2016-10-29 NOTE — Progress Notes (Signed)
2 mm adenoma Recall 7 yrs 2025 My Chart letter

## 2016-10-30 ENCOUNTER — Other Ambulatory Visit: Payer: Self-pay | Admitting: Family Medicine

## 2016-11-05 ENCOUNTER — Other Ambulatory Visit: Payer: Self-pay | Admitting: Family Medicine

## 2016-11-06 ENCOUNTER — Other Ambulatory Visit: Payer: Self-pay

## 2016-11-06 MED ORDER — SERTRALINE HCL 50 MG PO TABS: 50.0000 mg | ORAL_TABLET | Freq: Every day | ORAL | 0 refills | Status: DC

## 2016-11-06 NOTE — Telephone Encounter (Signed)
Left detailed message on patients voicemail that rx sent to pharmacy for #30.

## 2016-11-14 ENCOUNTER — Encounter: Payer: Self-pay | Admitting: Family Medicine

## 2016-11-14 ENCOUNTER — Ambulatory Visit (INDEPENDENT_AMBULATORY_CARE_PROVIDER_SITE_OTHER): Payer: No Typology Code available for payment source | Admitting: Family Medicine

## 2016-11-14 VITALS — BP 135/75 | HR 82 | Temp 97.6°F | Ht 63.0 in | Wt 217.0 lb

## 2016-11-14 DIAGNOSIS — F329 Major depressive disorder, single episode, unspecified: Secondary | ICD-10-CM

## 2016-11-14 DIAGNOSIS — F32A Depression, unspecified: Secondary | ICD-10-CM

## 2016-11-14 MED ORDER — SERTRALINE HCL 100 MG PO TABS: 100.0000 mg | ORAL_TABLET | Freq: Every day | ORAL | 2 refills | Status: DC

## 2016-11-14 NOTE — Progress Notes (Signed)
BP 135/75   Pulse 82   Temp 97.6 F (36.4 C) (Oral)   Ht 5\' 3"  (1.6 m)   Wt 217 lb (98.4 kg)   LMP 05/04/2010   BMI 38.44 kg/m    Subjective:    Patient ID: Desiree Cooper, female    DOB: 1960/10/10, 56 y.o.   MRN: [id number]  HPI: Desiree Cooper is a 56 y.o. female presenting on 11/14/2016 for Medication Refill (thinks Zoloft needs to be increased)   HPI Depression recheck Patient is coming today for a depression recheck. She has been on Zoloft 50 for quite some time and it has been working pretty well for her but recently she does say that she's been having a little more crying episodes a little more reactionary than she normally is and feels like it may be fading and she would like to go up on the dose. She denies any suicidal ideations or thoughts of hurting herself. She is sleeping well at night Depression screen [facility] 2/9 11/14/2016 09/07/2016 07/30/2016 11/08/2015 10/28/2015  Decreased Interest 1 0 0 0 0  Down, Depressed, Hopeless 2 0 0 0 0  PHQ - 2 Score 3 0 0 0 0  Altered sleeping 0 - - - -  Tired, decreased energy 1 - - - -  Change in appetite 1 - - - -  Feeling bad or failure about yourself  0 - - - -  Trouble concentrating 1 - - - -  Moving slowly or fidgety/restless 0 - - - -  Suicidal thoughts 0 - - - -  PHQ-9 Score 6 - - - -  Difficult doing work/chores Not difficult at all - - - -     Relevant past medical, surgical, family and social history reviewed and updated as indicated. Interim medical history since our last visit reviewed. Allergies and medications reviewed and updated.  Review of Systems  Constitutional: Negative for chills and fever.  Respiratory: Negative for chest tightness and shortness of breath.   Cardiovascular: Negative for chest pain and leg swelling.  Musculoskeletal: Negative for back pain and gait problem.  Skin: Negative for rash.  Neurological: Negative for light-headedness and headaches.  Psychiatric/Behavioral: Positive for dysphoric mood.  Negative for agitation, behavioral problems, self-injury, sleep disturbance and suicidal ideas. The patient is not nervous/anxious.   All other systems reviewed and are negative.   Per HPI unless specifically indicated above        Objective:    BP 135/75   Pulse 82   Temp 97.6 F (36.4 C) (Oral)   Ht 5\' 3"  (1.6 m)   Wt 217 lb (98.4 kg)   LMP 05/04/2010   BMI 38.44 kg/m   Wt Readings from Last 3 Encounters:  11/14/16 217 lb (98.4 kg)  10/23/16 216 lb (98 kg)  10/09/16 216 lb (98 kg)    Physical Exam  Constitutional: She is oriented to person, place, and time. She appears well-developed and well-nourished. No distress.  Eyes: Conjunctivae are normal.  Cardiovascular: Normal rate, regular rhythm, normal heart sounds and intact distal pulses.   No murmur heard. Pulmonary/Chest: Effort normal and breath sounds normal. No respiratory distress. She has no wheezes.  Musculoskeletal: Normal range of motion. She exhibits no edema.  Neurological: She is alert and oriented to person, place, and time. Coordination normal.  Skin: Skin is warm and dry. No rash noted. She is not diaphoretic.  Psychiatric: Her behavior is normal. Judgment normal. Her mood appears anxious. She exhibits a depressed  mood. She expresses no suicidal ideation. She expresses no suicidal plans.  Nursing note and vitals reviewed.     Assessment & Plan:   Problem List Items Addressed This Visit      Other   Depression - Primary   Relevant Medications   sertraline (ZOLOFT) 100 MG tablet       Follow up plan: Return in about 6 months (around 05/14/2017), or if symptoms worsen or fail to improve, for Depression recheck.  Counseling provided for all of the vaccine components No orders of the defined types were placed in this encounter.   Caryl Pina, MD [facility] 11/14/2016, 4:42 PM

## 2016-11-30 ENCOUNTER — Other Ambulatory Visit: Payer: Self-pay | Admitting: Family Medicine

## 2016-11-30 DIAGNOSIS — G2581 Restless legs syndrome: Secondary | ICD-10-CM

## 2016-12-01 ENCOUNTER — Other Ambulatory Visit: Payer: Self-pay | Admitting: Family Medicine

## 2016-12-01 DIAGNOSIS — J4530 Mild persistent asthma, uncomplicated: Secondary | ICD-10-CM

## 2016-12-03 ENCOUNTER — Telehealth: Payer: Self-pay | Admitting: Family Medicine

## 2016-12-05 ENCOUNTER — Other Ambulatory Visit: Payer: Self-pay | Admitting: Family Medicine

## 2016-12-16 ENCOUNTER — Other Ambulatory Visit: Payer: Self-pay | Admitting: *Deleted

## 2016-12-16 DIAGNOSIS — J4530 Mild persistent asthma, uncomplicated: Secondary | ICD-10-CM

## 2016-12-16 DIAGNOSIS — G2581 Restless legs syndrome: Secondary | ICD-10-CM

## 2016-12-16 MED ORDER — PRAMIPEXOLE DIHYDROCHLORIDE 0.125 MG PO TABS: ORAL_TABLET | ORAL | 2 refills | Status: DC

## 2016-12-16 MED ORDER — BUDESONIDE-FORMOTEROL FUMARATE 160-4.5 MCG/ACT IN AERO: 2.0000 | INHALATION_SPRAY | Freq: Two times a day (BID) | RESPIRATORY_TRACT | 2 refills | Status: DC

## 2016-12-16 MED ORDER — SERTRALINE HCL 100 MG PO TABS: 100.0000 mg | ORAL_TABLET | Freq: Every day | ORAL | 2 refills | Status: DC

## 2016-12-16 NOTE — Telephone Encounter (Signed)
TC from pt her insurance now requires her medications to go through [employer] sent since they were just done to [facility] on 12/02/16

## 2017-03-28 ENCOUNTER — Telehealth: Payer: Self-pay | Admitting: Family Medicine

## 2017-03-28 DIAGNOSIS — G2581 Restless legs syndrome: Secondary | ICD-10-CM

## 2017-03-31 MED ORDER — PRAMIPEXOLE DIHYDROCHLORIDE 0.125 MG PO TABS: ORAL_TABLET | ORAL | 2 refills | Status: DC

## 2017-03-31 NOTE — Telephone Encounter (Signed)
Aware, per message, mirapex sent to [facility].

## 2017-03-31 NOTE — Telephone Encounter (Signed)
Will go ahead and send the medication to the other pharmacy.

## 2017-06-17 ENCOUNTER — Telehealth: Payer: Self-pay | Admitting: Family Medicine

## 2017-06-20 ENCOUNTER — Ambulatory Visit: Payer: No Typology Code available for payment source | Admitting: Family Medicine

## 2017-07-07 ENCOUNTER — Other Ambulatory Visit: Payer: No Typology Code available for payment source

## 2017-07-07 DIAGNOSIS — Z0000 Encounter for general adult medical examination without abnormal findings: Secondary | ICD-10-CM

## 2017-07-08 ENCOUNTER — Other Ambulatory Visit: Payer: Self-pay | Admitting: Family Medicine

## 2017-07-08 DIAGNOSIS — J0110 Acute frontal sinusitis, unspecified: Secondary | ICD-10-CM

## 2017-07-09 ENCOUNTER — Telehealth: Payer: Self-pay | Admitting: Family Medicine

## 2017-07-09 LAB — CBC WITH DIFFERENTIAL/PLATELET
BASOS: 1 %
Basophils Absolute: 0.1 10*3/uL (ref 0.0–0.2)
EOS (ABSOLUTE): 0.5 10*3/uL — ABNORMAL HIGH (ref 0.0–0.4)
Eos: 6 %
HEMATOCRIT: 44.1 % (ref 34.0–46.6)
HEMOGLOBIN: 14.2 g/dL (ref 11.1–15.9)
Immature Grans (Abs): 0 10*3/uL (ref 0.0–0.1)
Immature Granulocytes: 0 %
LYMPHS: 28 %
Lymphocytes Absolute: 2.4 10*3/uL (ref 0.7–3.1)
MCH: 28.2 pg (ref 26.6–33.0)
MCHC: 32.2 g/dL (ref 31.5–35.7)
MCV: 88 fL (ref 79–97)
MONOCYTES: 6 %
Monocytes Absolute: 0.5 10*3/uL (ref 0.1–0.9)
NEUTROS ABS: 4.9 10*3/uL (ref 1.4–7.0)
Neutrophils: 59 %
Platelets: 332 10*3/uL (ref 150–450)
RBC: 5.03 x10E6/uL (ref 3.77–5.28)
RDW: 13 % (ref 12.3–15.4)
WBC: 8.4 10*3/uL (ref 3.4–10.8)

## 2017-07-09 LAB — LIPID PANEL
CHOLESTEROL TOTAL: 198 mg/dL (ref 100–199)
Chol/HDL Ratio: 5.1 ratio — ABNORMAL HIGH (ref 0.0–4.4)
HDL: 39 mg/dL — ABNORMAL LOW (ref >39)
LDL CALC: 125 mg/dL — AB (ref 0–99)
TRIGLYCERIDES: 169 mg/dL — AB (ref 0–149)
VLDL Cholesterol Cal: 34 mg/dL (ref 5–40)

## 2017-07-09 LAB — CMP14+EGFR
ALK PHOS: 69 IU/L (ref 39–117)
ALT: 41 IU/L — ABNORMAL HIGH (ref 0–32)
AST: 34 IU/L (ref 0–40)
Albumin/Globulin Ratio: 1.9 (ref 1.2–2.2)
Albumin: 4.3 g/dL (ref 3.5–5.5)
BUN/Creatinine Ratio: 14 (ref 9–23)
BUN: 12 mg/dL (ref 6–24)
Bilirubin Total: <0.2 mg/dL (ref 0.0–1.2)
CO2: 22 mmol/L (ref 20–29)
CREATININE: 0.85 mg/dL (ref 0.57–1.00)
Calcium: 9.5 mg/dL (ref 8.7–10.2)
Chloride: 105 mmol/L (ref 96–106)
GFR calc Af Amer: 87 mL/min/{1.73_m2} (ref >59)
GFR calc non Af Amer: 76 mL/min/{1.73_m2} (ref >59)
Globulin, Total: 2.3 g/dL (ref 1.5–4.5)
Glucose: 110 mg/dL — ABNORMAL HIGH (ref 65–99)
POTASSIUM: 4.8 mmol/L (ref 3.5–5.2)
Sodium: 142 mmol/L (ref 134–144)
Total Protein: 6.6 g/dL (ref 6.0–8.5)

## 2017-07-09 LAB — THYROID PANEL WITH TSH
FREE THYROXINE INDEX: 1.4 (ref 1.2–4.9)
T3 Uptake Ratio: 21 % — ABNORMAL LOW (ref 24–39)
T4, Total: 6.5 ug/dL (ref 4.5–12.0)
TSH: 1.26 u[IU]/mL (ref 0.450–4.500)

## 2017-07-10 NOTE — Telephone Encounter (Signed)
Patient aware of results.

## 2017-07-11 ENCOUNTER — Encounter: Payer: Self-pay | Admitting: Family Medicine

## 2017-07-11 ENCOUNTER — Ambulatory Visit (INDEPENDENT_AMBULATORY_CARE_PROVIDER_SITE_OTHER): Payer: BLUE CROSS/BLUE SHIELD | Admitting: Family Medicine

## 2017-07-11 VITALS — BP 136/84 | HR 78 | Temp 98.4°F | Ht 63.0 in | Wt 229.0 lb

## 2017-07-11 DIAGNOSIS — J4540 Moderate persistent asthma, uncomplicated: Secondary | ICD-10-CM

## 2017-07-11 DIAGNOSIS — Z0000 Encounter for general adult medical examination without abnormal findings: Secondary | ICD-10-CM | POA: Diagnosis not present

## 2017-07-11 DIAGNOSIS — G4700 Insomnia, unspecified: Secondary | ICD-10-CM

## 2017-07-11 DIAGNOSIS — Z9189 Other specified personal risk factors, not elsewhere classified: Secondary | ICD-10-CM

## 2017-07-11 MED ORDER — SERTRALINE HCL 100 MG PO TABS: 100.0000 mg | ORAL_TABLET | Freq: Every day | ORAL | 2 refills | Status: DC

## 2017-07-11 MED ORDER — TRAZODONE HCL 50 MG PO TABS: 25.0000 mg | ORAL_TABLET | Freq: Every evening | ORAL | 3 refills | Status: DC | PRN

## 2017-07-11 NOTE — Progress Notes (Signed)
BP 136/84   Pulse 78   Temp 98.4 F (36.9 C) (Oral)   Ht 5' 3"  (1.6 m)   Wt 229 lb (103.9 kg)   LMP 05/04/2010   BMI 40.57 kg/m    Subjective:    Patient ID: Desiree Cooper, female    DOB: [birth year], 57 y.o.   MRN: [id number]  HPI: Desiree Cooper is a 57 y.o. female presenting on 07/11/2017 for Annual Exam   HPI Adult well exam Patient is coming in today for adult well exam and physical.  Patient has known asthma and uses Symbicort but does not use it every day and uses Flonase as well.  She feels like these do pretty well for her and just needed a refill on the Flonase.  Patient does take Mirapex for insomnia and restless leg but says that it does not work completely and would like to try something different for insomnia.  She has heard of trazodone and would like to try that.  Patient is on an antidepressant currently and says that it works great for her and denies any major issues with anxiety or depression or mood swings.  Patient is morbidly obese and we discussed weight loss options.  We discussed lifestyle changes and referral to nutrition and patient is agreeable.  Patient has a low 10-year risk based on current numbers.  She did have blood work done just last week so we reviewed that and there is still an A1c pending because her glucose was elevated. Patient denies any chest pain, shortness of breath, headaches or vision issues, abdominal complaints, diarrhea, nausea, vomiting, or joint issues.   Relevant past medical, surgical, family and social history reviewed and updated as indicated. Interim medical history since our last visit reviewed. Allergies and medications reviewed and updated.  Review of Systems  Constitutional: Negative for chills and fever.  HENT: Positive for congestion. Negative for ear discharge and ear pain.   Eyes: Negative for redness and visual disturbance.  Respiratory: Negative for chest tightness and shortness of breath.   Cardiovascular: Negative for  chest pain and leg swelling.  Genitourinary: Negative for difficulty urinating and dysuria.  Musculoskeletal: Negative for back pain and gait problem.  Skin: Negative for rash.  Neurological: Negative for light-headedness and headaches.  Psychiatric/Behavioral: Positive for sleep disturbance. Negative for agitation and behavioral problems.  All other systems reviewed and are negative.   Per HPI unless specifically indicated above   Allergies as of 07/11/2017      Reactions   Penicillins Rash      Medication List        Accurate as of 07/11/17  4:17 PM. Always use your most recent med list.          budesonide-formoterol 160-4.5 MCG/ACT inhaler Commonly known as:  SYMBICORT Inhale 2 puffs into the lungs 2 (two) times daily.   fluticasone 50 MCG/ACT nasal spray Commonly known as:  FLONASE PLACE 1 SPRAY INTO BOTH NOSTRILS 2 (TWO) TIMES DAILY AS NEEDED FOR ALLERGIES OR RHINITIS.   pramipexole 0.125 MG tablet Commonly known as:  MIRAPEX TAKE 1 TABLET (0.125 MG TOTAL) BY MOUTH 2 TO 3 HOURS BEFORE BEDTIME.   sertraline 100 MG tablet Commonly known as:  ZOLOFT Take 1 tablet (100 mg total) by mouth daily.   traZODone 50 MG tablet Commonly known as:  DESYREL Take 0.5-1 tablets (25-50 mg total) by mouth at bedtime as needed for sleep.          Objective:  BP 136/84   Pulse 78   Temp 98.4 F (36.9 C) (Oral)   Ht 5' 3"  (1.6 m)   Wt 229 lb (103.9 kg)   LMP 05/04/2010   BMI 40.57 kg/m   Wt Readings from Last 3 Encounters:  07/11/17 229 lb (103.9 kg)  11/14/16 217 lb (98.4 kg)  10/23/16 216 lb (98 kg)    Physical Exam  Constitutional: She is oriented to person, place, and time. She appears well-developed and well-nourished. No distress.  Eyes: Pupils are equal, round, and reactive to light. Conjunctivae and EOM are normal.  Cardiovascular: Normal rate, regular rhythm, normal heart sounds and intact distal pulses.  No murmur heard. Pulmonary/Chest: Effort normal  and breath sounds normal. No respiratory distress. She has no wheezes.  Musculoskeletal: Normal range of motion. She exhibits no tenderness.  Neurological: She is alert and oriented to person, place, and time. Coordination normal.  Skin: Skin is warm and dry. No rash noted. She is not diaphoretic.  Psychiatric: She has a normal mood and affect. Her behavior is normal.  Nursing note and vitals reviewed.   Results for orders placed or performed in visit on 07/07/17  CMP14+EGFR  Result Value Ref Range   Glucose 110 (H) 65 - 99 mg/dL   BUN 12 6 - 24 mg/dL   Creatinine, Ser 0.85 0.57 - 1.00 mg/dL   GFR calc non Af Amer 76 >59 mL/min/1.73   GFR calc Af Amer 87 >59 mL/min/1.73   BUN/Creatinine Ratio 14 9 - 23   Sodium 142 134 - 144 mmol/L   Potassium 4.8 3.5 - 5.2 mmol/L   Chloride 105 96 - 106 mmol/L   CO2 22 20 - 29 mmol/L   Calcium 9.5 8.7 - 10.2 mg/dL   Total Protein 6.6 6.0 - 8.5 g/dL   Albumin 4.3 3.5 - 5.5 g/dL   Globulin, Total 2.3 1.5 - 4.5 g/dL   Albumin/Globulin Ratio 1.9 1.2 - 2.2   Bilirubin Total <0.2 0.0 - 1.2 mg/dL   Alkaline Phosphatase 69 39 - 117 IU/L   AST 34 0 - 40 IU/L   ALT 41 (H) 0 - 32 IU/L  Lipid panel  Result Value Ref Range   Cholesterol, Total 198 100 - 199 mg/dL   Triglycerides 169 (H) 0 - 149 mg/dL   HDL 39 (L) >39 mg/dL   VLDL Cholesterol Cal 34 5 - 40 mg/dL   LDL Calculated 125 (H) 0 - 99 mg/dL   Chol/HDL Ratio 5.1 (H) 0.0 - 4.4 ratio  CBC with Differential/Platelet  Result Value Ref Range   WBC 8.4 3.4 - 10.8 x10E3/uL   RBC 5.03 3.77 - 5.28 x10E6/uL   Hemoglobin 14.2 11.1 - 15.9 g/dL   Hematocrit 44.1 34.0 - 46.6 %   MCV 88 79 - 97 fL   MCH 28.2 26.6 - 33.0 pg   MCHC 32.2 31.5 - 35.7 g/dL   RDW 13.0 12.3 - 15.4 %   Platelets 332 150 - 450 x10E3/uL   Neutrophils 59 Not Estab. %   Lymphs 28 Not Estab. %   Monocytes 6 Not Estab. %   Eos 6 Not Estab. %   Basos 1 Not Estab. %   Neutrophils Absolute 4.9 1.4 - 7.0 x10E3/uL   Lymphocytes  Absolute 2.4 0.7 - 3.1 x10E3/uL   Monocytes Absolute 0.5 0.1 - 0.9 x10E3/uL   EOS (ABSOLUTE) 0.5 (H) 0.0 - 0.4 x10E3/uL   Basophils Absolute 0.1 0.0 - 0.2 x10E3/uL   Immature Granulocytes  0 Not Estab. %   Immature Grans (Abs) 0.0 0.0 - 0.1 x10E3/uL  Thyroid Panel With TSH  Result Value Ref Range   TSH 1.260 0.450 - 4.500 uIU/mL   T4, Total 6.5 4.5 - 12.0 ug/dL   T3 Uptake Ratio 21 (L) 24 - 39 %   Free Thyroxine Index 1.4 1.2 - 4.9  Hgb A1c w/o eAG  Result Value Ref Range   Hgb A1c MFr Bld WILL FOLLOW   Specimen status report  Result Value Ref Range   specimen status report Comment       Assessment & Plan:   Problem List Items Addressed This Visit      Respiratory   Asthma, chronic     Other   Morbid obesity (HCC)   Relevant Orders   Amb ref to Medical Nutrition Therapy-MNT    Other Visit Diagnoses    Well adult exam    -  Primary   Insomnia, unspecified type       10 year risk of MI or stroke < 7.5%           Follow up plan: Return in about 1 year (around 07/12/2018), or if symptoms worsen or fail to improve.  Counseling provided for all of the vaccine components Orders Placed This Encounter  Procedures  . Amb ref to [place], MD [place] 07/11/2017, 4:17 PM  The 10-year ASCVD risk score Mikey Bussing DC Brooke Bonito., et al., 2013) is: 3.9%   Values used to calculate the score:     Age: 47 years     Sex: Female     Is Non-Hispanic African American: No     Diabetic: No     Tobacco smoker: No     Systolic Blood Pressure: 753 mmHg     Is BP treated: No     HDL Cholesterol: 39 mg/dL     Total Cholesterol: 198 mg/dL

## 2017-07-12 LAB — SPECIMEN STATUS REPORT

## 2017-07-12 LAB — HGB A1C W/O EAG: Hgb A1c MFr Bld: 6 % — ABNORMAL HIGH (ref 4.8–5.6)

## 2017-07-15 ENCOUNTER — Telehealth: Payer: Self-pay | Admitting: Family Medicine

## 2017-07-15 NOTE — Telephone Encounter (Signed)
Spoke with patient and she has contacted a nutritionist within the [facility] system that was recommended.

## 2017-07-16 ENCOUNTER — Telehealth: Payer: Self-pay | Admitting: Family Medicine

## 2017-07-25 ENCOUNTER — Other Ambulatory Visit: Payer: Self-pay | Admitting: Family Medicine

## 2017-07-25 DIAGNOSIS — J4530 Mild persistent asthma, uncomplicated: Secondary | ICD-10-CM

## 2017-07-25 MED ORDER — FLUTICASONE FUROATE-VILANTEROL 100-25 MCG/INH IN AEPB: 1.0000 | INHALATION_SPRAY | Freq: Every day | RESPIRATORY_TRACT | 1 refills | Status: DC

## 2017-07-25 NOTE — Telephone Encounter (Signed)
Pt requesting refill on Breo This is not on her current med list She stated a refill was discussed at her last OV If this is refilled she needs done today before 5pm before she losses her insurance today Please advise

## 2017-07-25 NOTE — Telephone Encounter (Signed)
Patient aware.

## 2017-07-25 NOTE — Telephone Encounter (Signed)
Please notify the patient that I sent in [place] for her Caryl Pina, MD [facility] 07/25/2017, 10:03 AM

## 2017-10-02 ENCOUNTER — Encounter (INDEPENDENT_AMBULATORY_CARE_PROVIDER_SITE_OTHER): Payer: BLUE CROSS/BLUE SHIELD

## 2017-10-03 ENCOUNTER — Other Ambulatory Visit: Payer: Self-pay | Admitting: Family Medicine

## 2017-10-03 NOTE — Telephone Encounter (Signed)
Last seen 07/11/17

## 2017-10-09 ENCOUNTER — Encounter (INDEPENDENT_AMBULATORY_CARE_PROVIDER_SITE_OTHER): Payer: Self-pay | Admitting: Family Medicine

## 2017-10-09 ENCOUNTER — Ambulatory Visit (INDEPENDENT_AMBULATORY_CARE_PROVIDER_SITE_OTHER): Payer: Self-pay | Admitting: Family Medicine

## 2017-10-09 VITALS — BP 123/81 | HR 65 | Temp 97.7°F | Ht 62.0 in | Wt 218.0 lb

## 2017-10-09 DIAGNOSIS — Z0289 Encounter for other administrative examinations: Secondary | ICD-10-CM

## 2017-10-09 DIAGNOSIS — R739 Hyperglycemia, unspecified: Secondary | ICD-10-CM

## 2017-10-09 DIAGNOSIS — Z6839 Body mass index (BMI) 39.0-39.9, adult: Secondary | ICD-10-CM

## 2017-10-09 DIAGNOSIS — Z9189 Other specified personal risk factors, not elsewhere classified: Secondary | ICD-10-CM

## 2017-10-09 DIAGNOSIS — R0602 Shortness of breath: Secondary | ICD-10-CM

## 2017-10-09 DIAGNOSIS — R5383 Other fatigue: Secondary | ICD-10-CM

## 2017-10-09 DIAGNOSIS — Z1331 Encounter for screening for depression: Secondary | ICD-10-CM

## 2017-10-10 ENCOUNTER — Telehealth: Payer: Self-pay | Admitting: Family Medicine

## 2017-10-10 ENCOUNTER — Other Ambulatory Visit: Payer: Self-pay | Admitting: Family Medicine

## 2017-10-10 LAB — CBC WITH DIFFERENTIAL
BASOS: 1 %
Basophils Absolute: 0.1 10*3/uL (ref 0.0–0.2)
EOS (ABSOLUTE): 0.4 10*3/uL (ref 0.0–0.4)
EOS: 4 %
HEMATOCRIT: 43.6 % (ref 34.0–46.6)
HEMOGLOBIN: 15 g/dL (ref 11.1–15.9)
IMMATURE GRANS (ABS): 0 10*3/uL (ref 0.0–0.1)
Immature Granulocytes: 0 %
LYMPHS: 25 %
Lymphocytes Absolute: 2.3 10*3/uL (ref 0.7–3.1)
MCH: 29.2 pg (ref 26.6–33.0)
MCHC: 34.4 g/dL (ref 31.5–35.7)
MCV: 85 fL (ref 79–97)
MONOCYTES: 6 %
Monocytes Absolute: 0.5 10*3/uL (ref 0.1–0.9)
NEUTROS ABS: 5.8 10*3/uL (ref 1.4–7.0)
Neutrophils: 64 %
RBC: 5.14 x10E6/uL (ref 3.77–5.28)
RDW: 13.5 % (ref 12.3–15.4)
WBC: 9.1 10*3/uL (ref 3.4–10.8)

## 2017-10-10 LAB — LIPID PANEL WITH LDL/HDL RATIO
Cholesterol, Total: 226 mg/dL — ABNORMAL HIGH (ref 100–199)
HDL: 42 mg/dL (ref >39)
LDL CALC: 154 mg/dL — AB (ref 0–99)
LDL/HDL RATIO: 3.7 ratio — AB (ref 0.0–3.2)
Triglycerides: 150 mg/dL — ABNORMAL HIGH (ref 0–149)
VLDL CHOLESTEROL CAL: 30 mg/dL (ref 5–40)

## 2017-10-10 LAB — COMPREHENSIVE METABOLIC PANEL
A/G RATIO: 1.8 (ref 1.2–2.2)
ALK PHOS: 80 IU/L (ref 39–117)
ALT: 38 IU/L — ABNORMAL HIGH (ref 0–32)
AST: 33 IU/L (ref 0–40)
Albumin: 4.7 g/dL (ref 3.5–5.5)
BILIRUBIN TOTAL: 0.3 mg/dL (ref 0.0–1.2)
BUN/Creatinine Ratio: 14 (ref 9–23)
BUN: 12 mg/dL (ref 6–24)
CO2: 21 mmol/L (ref 20–29)
Calcium: 9.7 mg/dL (ref 8.7–10.2)
Chloride: 101 mmol/L (ref 96–106)
Creatinine, Ser: 0.84 mg/dL (ref 0.57–1.00)
GFR calc Af Amer: 89 mL/min/{1.73_m2} (ref >59)
GFR calc non Af Amer: 77 mL/min/{1.73_m2} (ref >59)
GLOBULIN, TOTAL: 2.6 g/dL (ref 1.5–4.5)
Glucose: 96 mg/dL (ref 65–99)
POTASSIUM: 4.7 mmol/L (ref 3.5–5.2)
SODIUM: 140 mmol/L (ref 134–144)
Total Protein: 7.3 g/dL (ref 6.0–8.5)

## 2017-10-10 LAB — FOLATE: FOLATE: 4.1 ng/mL (ref >3.0)

## 2017-10-10 LAB — TSH: TSH: 1.5 u[IU]/mL (ref 0.450–4.500)

## 2017-10-10 LAB — VITAMIN D 25 HYDROXY (VIT D DEFICIENCY, FRACTURES): Vit D, 25-Hydroxy: 33.2 ng/mL (ref 30.0–100.0)

## 2017-10-10 LAB — HEMOGLOBIN A1C
Est. average glucose Bld gHb Est-mCnc: 126 mg/dL
HEMOGLOBIN A1C: 6 % — AB (ref 4.8–5.6)

## 2017-10-10 LAB — VITAMIN B12: Vitamin B-12: 676 pg/mL (ref 232–1245)

## 2017-10-10 LAB — T4, FREE: FREE T4: 1.19 ng/dL (ref 0.82–1.77)

## 2017-10-10 LAB — INSULIN, RANDOM: INSULIN: 20.4 u[IU]/mL (ref 2.6–24.9)

## 2017-10-10 LAB — T3: T3, Total: 120 ng/dL (ref 71–180)

## 2017-10-10 NOTE — Telephone Encounter (Signed)
appt given for Tdap

## 2017-10-13 ENCOUNTER — Ambulatory Visit (INDEPENDENT_AMBULATORY_CARE_PROVIDER_SITE_OTHER): Payer: Self-pay | Admitting: *Deleted

## 2017-10-13 DIAGNOSIS — Z23 Encounter for immunization: Secondary | ICD-10-CM

## 2017-10-13 NOTE — Progress Notes (Signed)
Office: [phone]  /  Fax: [phone]   Dear Dr. Warrick Parisian,   Thank you for referring Desiree Cooper to our clinic. The following note includes my evaluation and treatment recommendations.  HPI:   Chief Complaint: OBESITY    Desiree Desiree Cooper has been referred by Desiree Cooper A. [name], MD for consultation regarding her obesity and obesity related comorbidities.    Desiree Desiree Cooper (MR# [id number]) is a 57 y.o. female who presents on 10/09/2017 for obesity evaluation and treatment. Current BMI is Body mass index is 39.87 kg/m.Desiree Desiree Cooper has been struggling with her weight for many years and has been unsuccessful in either losing weight, maintaining weight loss, or reaching her healthy weight goal.     Desiree Desiree Cooper attended our information session and states she is currently in the action stage of change and ready to dedicate time achieving and maintaining a healthier weight. Desiree Desiree Cooper is interested in becoming our patient and working on intensive lifestyle modifications including (but not limited to) diet, exercise and weight loss.    Desiree Desiree Cooper states her family eats meals together she thinks her family will eat healthier with  her her desired weight loss is 53 lbs she started gaining weight after she stopped smoking gradually  her heaviest weight ever was 235 lbs she has significant food cravings issues  she snacks frequently in the evenings she wakes up frquently in the middle of the night to eat she skips meals frequently she is frequently drinking liquids with calories she frequently makes poor food choices she frequently eats larger portions than normal  she struggles with emotional eating    Fatigue Desiree Desiree Cooper feels her energy is lower than it should be. This has worsened with weight gain and has not worsened recently. Desiree Desiree Cooper admits to daytime somnolence and  admits to waking up still tired. Patient is at risk for obstructive sleep apnea. Patent has a history of symptoms of daytime fatigue and morning  headache. Patient generally gets 5 hours of sleep per night, and states they generally have nightime awakenings. Snoring is present. Apneic episodes are present. Epworth Sleepiness Score is 8.  Dyspnea on exertion Desiree Desiree Cooper is with a history of chronic bronchitis and she is on medications. Desiree Desiree Cooper notes increasing shortness of breath with exercising and seems to be worsening over time with weight gain. She notes getting out of breath sooner with activity than she used to. This has not gotten worse recently. Hero denies orthopnea.  Positive Depression Screening Desiree Cooper has a positive depression screening, her PHQ9 is at 23 with significant emotional eating behaviors. She denies suicidal ideas or homicidal ideas.  Hyperglycemia Desiree Desiree Cooper has a history of some elevated blood glucose readings without a diagnosis of diabetes. She is not on metformin and her last A1c was at 6.0. She denies polyphagia.  At risk for diabetes Desiree Cooper is at higher than average risk for developing diabetes due to her obesity and hyperglycemia. She currently admits to polyuria but denies polydipsia.  Depression Screen Desiree Cooper's Food and Mood (modified PHQ-9) score was  Depression screen PHQ 2/9 10/09/2017  Decreased Interest 3  Down, Depressed, Hopeless 3  PHQ - 2 Score 6  Altered sleeping 3  Tired, decreased energy 3  Change in appetite 3  Feeling bad or failure about yourself  3  Trouble concentrating 3  Moving slowly or fidgety/restless 2  Suicidal thoughts 0  PHQ-9 Score 23  Difficult doing work/chores Extremely dIfficult    ALLERGIES: Allergies  Allergen Reactions  . Penicillins Rash    MEDICATIONS: Current Outpatient  Medications on File Prior to Visit  Medication Sig Dispense Refill  . budesonide-formoterol (SYMBICORT) 160-4.5 MCG/ACT inhaler Inhale 2 puffs into the lungs 2 (two) times daily.    . diphenhydrAMINE (BENADRYL) 25 MG tablet Take 25 mg by mouth at bedtime as needed.    . fluticasone  furoate-vilanterol (BREO ELLIPTA) 100-25 MCG/INH AEPB Inhale 1 puff into the lungs daily. 90 each 1  . pramipexole (MIRAPEX) 0.125 MG tablet TAKE 1 TABLET (0.125 MG TOTAL) BY MOUTH 2 TO 3 HOURS BEFORE BEDTIME. 90 tablet 2  . sertraline (ZOLOFT) 100 MG tablet Take 1 tablet (100 mg total) by mouth daily. 90 tablet 2  . traZODone (DESYREL) 50 MG tablet TAKE 0.5-1 TABLETS (25-50 MG TOTAL) BY MOUTH AT BEDTIME AS NEEDED FOR SLEEP. 90 tablet 0   No current facility-administered medications on file prior to visit.     PAST MEDICAL HISTORY: Past Medical History:  Diagnosis Date  . Asthma    inhaler use for 4 years  . Back pain   . Chest pain   . Decreased libido   . Depression   . Dyspnea   . Fatigue   . Fatty liver   . Hot flashes   . Hx of adenomatous polyp of colon 10/29/2016  . Hypertension   . Joint pain   . Labial abscess   . Leg edema   . Prediabetes   . Sleep apnea     PAST SURGICAL HISTORY: Past Surgical History:  Procedure Laterality Date  . TONSILLECTOMY    . WISDOM TOOTH EXTRACTION      SOCIAL HISTORY: Social History   Tobacco Use  . Smoking status: Former Smoker    Packs/day: 0.50    Years: 20.00    Pack years: 10.00  . Smokeless tobacco: Never Used  Substance Use Topics  . Alcohol use: Yes    Comment: socially- 2 time month per pt-wine  . Drug use: No    FAMILY HISTORY: Family History  Problem Relation Age of Onset  . COPD Mother   . Arthritis Mother   . Depression Mother   . Diabetes Mother   . Hypertension Mother   . Hyperlipidemia Mother   . Thyroid disease Mother   . Alcohol abuse Father        deceased age 52  . Hyperlipidemia Father   . Hypertension Father   . Liver disease Father   . Rheum arthritis Other        grandfather, not indicated if paternal or maternal   . Colon cancer Neg Hx     ROS: Review of Systems  Constitutional: Positive for malaise/fatigue. Negative for weight loss.       + Trouble sleeping  Respiratory: Positive  for cough, shortness of breath (with exertion) and wheezing.        + Difficulty breathing  Cardiovascular: Negative for orthopnea.  Genitourinary: Positive for frequency.  Musculoskeletal: Positive for back pain.       + Muscle or joint pain + Muscle stiffness  Skin:       + Dryness  Neurological: Positive for weakness and headaches.  Endo/Heme/Allergies: Negative for polydipsia. Bruises/bleeds easily.       Negative polyphagia  Psychiatric/Behavioral: Positive for depression. Negative for suicidal ideas.    PHYSICAL EXAM: Blood pressure 123/81, pulse 65, temperature 97.7 F (36.5 C), temperature source Oral, height 5\' 2"  (1.575 m), weight 218 lb (98.9 kg), last menstrual period 05/04/2010, SpO2 99 %. Body mass index is 39.87 kg/m. Physical  Exam  Constitutional: She is oriented to person, place, and time. She appears well-developed and well-nourished.  HENT:  Head: Normocephalic and atraumatic.  Nose: Nose normal.  Eyes: EOM are normal. No scleral icterus.  Neck: Normal range of motion. Neck supple. No thyromegaly present.  Cardiovascular: Normal rate and regular rhythm.  Pulmonary/Chest: Effort normal. No respiratory distress.  Abdominal: Soft. There is no tenderness.  + Obesity  Musculoskeletal:  Range of Motion normal in all 4 extremities 1+ edema noted in bilateral lower extremities  Neurological: She is alert and oriented to person, place, and time. Coordination normal.  Skin: Skin is warm and dry.  Psychiatric: She has a normal mood and affect.  Vitals reviewed.   RECENT LABS AND TESTS: BMET    Component Value Date/Time   NA 140 10/09/2017 1008   K 4.7 10/09/2017 1008   CL 101 10/09/2017 1008   CO2 21 10/09/2017 1008   GLUCOSE 96 10/09/2017 1008   GLUCOSE 85 07/26/2009 1440   BUN 12 10/09/2017 1008   CREATININE 0.84 10/09/2017 1008   CALCIUM 9.7 10/09/2017 1008   GFRNONAA 77 10/09/2017 1008   GFRAA 89 10/09/2017 1008   Lab Results  Component Value Date     HGBA1C 6.0 (H) 10/09/2017   Lab Results  Component Value Date   INSULIN 20.4 10/09/2017   CBC    Component Value Date/Time   WBC 9.1 10/09/2017 1008   WBC 11.4 (H) 07/26/2009 1440   RBC 5.14 10/09/2017 1008   RBC 4.82 07/26/2009 1440   HGB 15.0 10/09/2017 1008   HCT 43.6 10/09/2017 1008   PLT 332 07/07/2017 1800   MCV 85 10/09/2017 1008   MCH 29.2 10/09/2017 1008   MCHC 34.4 10/09/2017 1008   MCHC 34.6 07/26/2009 1440   RDW 13.5 10/09/2017 1008   LYMPHSABS 2.3 10/09/2017 1008   MONOABS 0.8 07/26/2009 1440   EOSABS 0.4 10/09/2017 1008   BASOSABS 0.1 10/09/2017 1008   Iron/TIBC/Ferritin/ %Sat No results found for: IRON, TIBC, FERRITIN, IRONPCTSAT Lipid Panel     Component Value Date/Time   CHOL 226 (H) 10/09/2017 1008   TRIG 150 (H) 10/09/2017 1008   HDL 42 10/09/2017 1008   CHOLHDL 5.1 (H) 07/07/2017 1800   CHOLHDL 5 07/26/2009 1440   VLDL 41.2 (H) 07/26/2009 1440   LDLCALC 154 (H) 10/09/2017 1008   LDLDIRECT 163.5 07/26/2009 1440   Hepatic Function Panel     Component Value Date/Time   PROT 7.3 10/09/2017 1008   ALBUMIN 4.7 10/09/2017 1008   AST 33 10/09/2017 1008   ALT 38 (H) 10/09/2017 1008   ALKPHOS 80 10/09/2017 1008   BILITOT 0.3 10/09/2017 1008   BILIDIR 0.1 07/26/2009 1440      Component Value Date/Time   TSH 1.500 10/09/2017 1008   TSH 1.260 07/07/2017 1800   TSH 1.462 11/01/2011 1431    ECG  shows NSR with a rate of 71 BPM INDIRECT CALORIMETER done today shows a VO2 of 211 and a REE of 1467.  Her calculated basal metabolic rate is 8546 thus her basal metabolic rate is worse than expected.    ASSESSMENT AND PLAN: Other fatigue - Plan: EKG 12-Lead, Vitamin B12, CBC With Differential, Folate, Lipid Panel With LDL/HDL Ratio, T3, T4, free, TSH, VITAMIN D 25 Hydroxy (Vit-D Deficiency, Fractures)  Shortness of breath on exertion - Plan: CBC With Differential  Positive depression screening  Hyperglycemia - Plan: Comprehensive metabolic panel,  Hemoglobin A1c, Insulin, random  At risk for diabetes  mellitus  Class 2 severe obesity with serious comorbidity and body mass index (BMI) of 39.0 to 39.9 in adult, unspecified obesity type ([place])  PLAN:  Fatigue Desiree Desiree Cooper was informed that her fatigue may be related to obesity, depression or many other causes. Labs will be ordered, and in the meanwhile Desiree Cooper has agreed to work on diet, exercise and weight loss to help with fatigue. Proper sleep hygiene was discussed including the need for 7-8 hours of quality sleep each night. A sleep study was not ordered based on symptoms and Epworth score.  Dyspnea on exertion Desiree Cooper's shortness of breath appears to be obesity related and exercise induced. She has agreed to work on weight loss and gradually increase exercise to treat her exercise induced shortness of breath. If Desiree Desiree Cooper follows our instructions and loses weight without improvement of her shortness of breath, we will plan to refer to pulmonology. We will monitor this condition regularly. Desiree Desiree Cooper agrees to this plan.  Positive Depression Screening Desiree Desiree Cooper had a positive depression screening. We will monitor this closely and work on CBT to help improve the non-hunger eating patterns. We will refer to Dr. Mallie Mussel for evaluation and therapy. Desiree Cooper agrees to follow up with our clinic in 2 weeks with myself and Dr. Mallie Mussel, our bariatric psychologist.  Hyperglycemia Fasting labs will be obtained and results with be discussed with Desiree Cooper in 2 weeks at her follow up visit. In the meanwhile Desiree Desiree Cooper was started on a lower simple carbohydrate diet and will work on weight loss efforts.  Diabetes risk counselling Desiree Cooper was given extended (15 minutes) diabetes prevention counseling today. She is 57 y.o. female and has risk factors for diabetes including obesity and hyperglycemia. We discussed intensive lifestyle modifications today with an emphasis on weight loss as well as increasing exercise and decreasing simple  carbohydrates in her diet.  Depression Screen Desiree Desiree Cooper had a strongly positive depression screening. Depression is commonly associated with obesity and often results in emotional eating behaviors. We will monitor this closely and work on CBT to help improve the non-hunger eating patterns. Referral to Psychology may be required if no improvement is seen as she continues in our clinic.  Obesity Desiree Cooper is currently in the action stage of change and her goal is to continue with weight loss efforts. I recommend Desiree Desiree Cooper begin the structured treatment plan as follows:  She has agreed to follow the Category 2 plan Kriste has been instructed to eventually work up to a goal of 150 minutes of combined cardio and strengthening exercise per week for weight loss and overall health benefits. We discussed the following Behavioral Modification Strategies today: increasing lean protein intake, decreasing simple carbohydrates  and work on meal planning and easy cooking plans   She was informed of the importance of frequent follow up visits to maximize her success with intensive lifestyle modifications for her multiple health conditions. She was informed we would discuss her lab results at her next visit unless there is a critical issue that needs to be addressed sooner. Kenzie agreed to keep her next visit at the agreed upon time to discuss these results.    OBESITY BEHAVIORAL INTERVENTION VISIT  Today's visit was # 1.   Starting weight: 218 lbs Starting date: 10/09/17 Today's weight : 218 lbs  Today's date: 10/09/2017 Total lbs lost to date: 0 At least 15 minutes were spent on discussing the following behavioral intervention visit.   ASK: We discussed the diagnosis of obesity with Erinn Radermacher today and Genevia agreed to give  [place] permission to discuss obesity behavioral modification therapy today.  ASSESS: Siham has the diagnosis of obesity and her BMI today is 38.86 Isabellamarie is in the action stage of change    ADVISE: Nikayla was educated on the multiple health risks of obesity as well as the benefit of weight loss to improve her health. She was advised of the need for long term treatment and the importance of lifestyle modifications to improve her current health and to decrease her risk of future health problems.  AGREE: Multiple dietary modification options and treatment options were discussed and  Amorah agreed to follow the recommendations documented in the above note.  ARRANGE: Dera was educated on the importance of frequent visits to treat obesity as outlined per CMS and USPSTF guidelines and agreed to schedule her next follow up appointment today.  I, Trixie Dredge, am acting as transcriptionist for Dennard Nip, MD  I have reviewed the above documentation for accuracy and completeness, and I agree with the above. -Dennard Nip, MD

## 2017-10-13 NOTE — Progress Notes (Signed)
Pt given Tdap Tolerated well 

## 2017-10-21 ENCOUNTER — Telehealth (INDEPENDENT_AMBULATORY_CARE_PROVIDER_SITE_OTHER): Payer: Self-pay

## 2017-10-21 NOTE — Telephone Encounter (Signed)
Pt would like to know if the frozen meal can take the place of the meat portion for dinner  [phone], may leave msg on vmail

## 2017-10-23 ENCOUNTER — Ambulatory Visit (INDEPENDENT_AMBULATORY_CARE_PROVIDER_SITE_OTHER): Payer: Self-pay | Admitting: Family Medicine

## 2017-10-28 ENCOUNTER — Ambulatory Visit (INDEPENDENT_AMBULATORY_CARE_PROVIDER_SITE_OTHER): Payer: Managed Care, Other (non HMO) | Admitting: Family Medicine

## 2017-10-28 VITALS — BP 131/78 | HR 66 | Temp 98.2°F | Ht 62.0 in | Wt 212.0 lb

## 2017-10-28 DIAGNOSIS — E782 Mixed hyperlipidemia: Secondary | ICD-10-CM

## 2017-10-28 DIAGNOSIS — Z6838 Body mass index (BMI) 38.0-38.9, adult: Secondary | ICD-10-CM

## 2017-10-28 DIAGNOSIS — R7303 Prediabetes: Secondary | ICD-10-CM

## 2017-10-28 DIAGNOSIS — E559 Vitamin D deficiency, unspecified: Secondary | ICD-10-CM | POA: Diagnosis not present

## 2017-10-28 MED ORDER — VITAMIN D (ERGOCALCIFEROL) 1.25 MG (50000 UNIT) PO CAPS: 50000.0000 [IU] | ORAL_CAPSULE | ORAL | 0 refills | Status: DC

## 2017-10-29 NOTE — Progress Notes (Signed)
Office: [phone]  /  Fax: [phone]   HPI:   Chief Complaint: OBESITY Desiree Cooper is here to discuss her progress with her obesity treatment plan. She is on the Category 2 plan and is following her eating plan approximately 80 % of the time. She states she is exercising 0 minutes 0 times per week. Desiree Cooper did well with weight loss on her Category 2 plan, but she struggled with some cravings. She had increased temptations at work and some sabotage from her mother.  Her weight is 212 lb (96.2 kg) today and has had a weight loss of 6 pounds over a period of 2 to 3 weeks since her last visit. She has lost 6 lbs since starting treatment with [place].  Hyperlipidemia (Mixed) Desiree Cooper has hyperlipidemia and has been trying to improve her cholesterol levels with intensive lifestyle modification including a low saturated fat diet, exercise and weight loss. Her LDL is elevated and HDL is low. She denies any chest pain, claudication or myalgias. She notes a family history of hyperlipidemia.  Vitamin D Deficiency Desiree Cooper has a new diagnosis of vitamin D deficiency. She is not on Vit D, she notes fatigue and denies nausea, vomiting or muscle weakness.  Pre-Diabetes Desiree Cooper has a new diagnosis of pre-diabetes based on her elevated Hgb A1c and was informed this puts her at greater risk of developing diabetes. Her A1c and insulin are elevated, she notes polyphagia and denies hypoglycemia. She is not on metformin and she is doing well on diet.  ALLERGIES: Allergies  Allergen Reactions  . Penicillins Rash    MEDICATIONS: Current Outpatient Medications on File Prior to Visit  Medication Sig Dispense Refill  . budesonide-formoterol (SYMBICORT) 160-4.5 MCG/ACT inhaler Inhale 2 puffs into the lungs 2 (two) times daily.    . diphenhydrAMINE (BENADRYL) 25 MG tablet Take 25 mg by mouth at bedtime as needed.    . fluticasone furoate-vilanterol (BREO ELLIPTA) 100-25 MCG/INH AEPB Inhale 1 puff into the lungs daily. 90 each  1  . pramipexole (MIRAPEX) 0.125 MG tablet TAKE 1 TABLET (0.125 MG TOTAL) BY MOUTH 2 TO 3 HOURS BEFORE BEDTIME. 90 tablet 2  . sertraline (ZOLOFT) 100 MG tablet Take 1 tablet (100 mg total) by mouth daily. 90 tablet 2  . traZODone (DESYREL) 50 MG tablet TAKE 0.5-1 TABLETS (25-50 MG TOTAL) BY MOUTH AT BEDTIME AS NEEDED FOR SLEEP. 90 tablet 0   No current facility-administered medications on file prior to visit.     PAST MEDICAL HISTORY: Past Medical History:  Diagnosis Date  . Asthma    inhaler use for 4 years  . Back pain   . Chest pain   . Decreased libido   . Depression   . Dyspnea   . Fatigue   . Fatty liver   . Hot flashes   . Hx of adenomatous polyp of colon 10/29/2016  . Hypertension   . Joint pain   . Labial abscess   . Leg edema   . Prediabetes   . Sleep apnea     PAST SURGICAL HISTORY: Past Surgical History:  Procedure Laterality Date  . TONSILLECTOMY    . WISDOM TOOTH EXTRACTION      SOCIAL HISTORY: Social History   Tobacco Use  . Smoking status: Former Smoker    Packs/day: 0.50    Years: 20.00    Pack years: 10.00  . Smokeless tobacco: Never Used  Substance Use Topics  . Alcohol use: Yes    Comment: socially- 2 time month  per pt-wine  . Drug use: No    FAMILY HISTORY: Family History  Problem Relation Age of Onset  . COPD Mother   . Arthritis Mother   . Depression Mother   . Diabetes Mother   . Hypertension Mother   . Hyperlipidemia Mother   . Thyroid disease Mother   . Alcohol abuse Father        deceased age 102  . Hyperlipidemia Father   . Hypertension Father   . Liver disease Father   . Rheum arthritis Other        grandfather, not indicated if paternal or maternal   . Colon cancer Neg Hx     ROS: Review of Systems  Constitutional: Positive for malaise/fatigue and weight loss.  Cardiovascular: Negative for chest pain and claudication.  Musculoskeletal: Negative for myalgias.       Negative muscle weakness  Endo/Heme/Allergies:         Positive polyphagia Negative hypoglycemia    PHYSICAL EXAM: Blood pressure 131/78, pulse 66, temperature 98.2 F (36.8 C), temperature source Oral, height 5\' 2"  (1.575 m), weight 212 lb (96.2 kg), last menstrual period 05/04/2010, SpO2 97 %. Body mass index is 38.78 kg/m. Physical Exam  Constitutional: She is oriented to person, place, and time. She appears well-developed and well-nourished.  Cardiovascular: Normal rate.  Pulmonary/Chest: Effort normal.  Musculoskeletal: Normal range of motion.  Neurological: She is oriented to person, place, and time.  Skin: Skin is warm and dry.  Psychiatric: She has a normal mood and affect. Her behavior is normal.  Vitals reviewed.   RECENT LABS AND TESTS: BMET    Component Value Date/Time   NA 140 10/09/2017 1008   K 4.7 10/09/2017 1008   CL 101 10/09/2017 1008   CO2 21 10/09/2017 1008   GLUCOSE 96 10/09/2017 1008   GLUCOSE 85 07/26/2009 1440   BUN 12 10/09/2017 1008   CREATININE 0.84 10/09/2017 1008   CALCIUM 9.7 10/09/2017 1008   GFRNONAA 77 10/09/2017 1008   GFRAA 89 10/09/2017 1008   Lab Results  Component Value Date   HGBA1C 6.0 (H) 10/09/2017   HGBA1C 6.0 (H) 07/07/2017   Lab Results  Component Value Date   INSULIN 20.4 10/09/2017   CBC    Component Value Date/Time   WBC 9.1 10/09/2017 1008   WBC 11.4 (H) 07/26/2009 1440   RBC 5.14 10/09/2017 1008   RBC 4.82 07/26/2009 1440   HGB 15.0 10/09/2017 1008   HCT 43.6 10/09/2017 1008   PLT 332 07/07/2017 1800   MCV 85 10/09/2017 1008   MCH 29.2 10/09/2017 1008   MCHC 34.4 10/09/2017 1008   MCHC 34.6 07/26/2009 1440   RDW 13.5 10/09/2017 1008   LYMPHSABS 2.3 10/09/2017 1008   MONOABS 0.8 07/26/2009 1440   EOSABS 0.4 10/09/2017 1008   BASOSABS 0.1 10/09/2017 1008   Iron/TIBC/Ferritin/ %Sat No results found for: IRON, TIBC, FERRITIN, IRONPCTSAT Lipid Panel     Component Value Date/Time   CHOL 226 (H) 10/09/2017 1008   TRIG 150 (H) 10/09/2017 1008   HDL  42 10/09/2017 1008   CHOLHDL 5.1 (H) 07/07/2017 1800   CHOLHDL 5 07/26/2009 1440   VLDL 41.2 (H) 07/26/2009 1440   LDLCALC 154 (H) 10/09/2017 1008   LDLDIRECT 163.5 07/26/2009 1440   Hepatic Function Panel     Component Value Date/Time   PROT 7.3 10/09/2017 1008   ALBUMIN 4.7 10/09/2017 1008   AST 33 10/09/2017 1008   ALT 38 (H) 10/09/2017 1008  ALKPHOS 80 10/09/2017 1008   BILITOT 0.3 10/09/2017 1008   BILIDIR 0.1 07/26/2009 1440      Component Value Date/Time   TSH 1.500 10/09/2017 1008   TSH 1.260 07/07/2017 1800   TSH 1.462 11/01/2011 1431  Results for BATHSHEBA, DURRETT (MRN [id number]) as of 10/29/2017 10:54  Ref. Range 10/09/2017 10:08  Vitamin D, 25-Hydroxy Latest Ref Range: 30.0 - 100.0 ng/mL 33.2    ASSESSMENT AND PLAN: Mixed hyperlipidemia  Vitamin D deficiency - Plan: Vitamin D, Ergocalciferol, (DRISDOL) 50000 units CAPS capsule  Pre-diabetes  Class 2 severe obesity with serious comorbidity and body mass index (BMI) of 38.0 to 38.9 in adult, unspecified obesity type ([place])  PLAN:  Hyperlipidemia (Mixed) Desiree Cooper was informed of the American Heart Association Guidelines emphasizing intensive lifestyle modifications as the first line treatment for hyperlipidemia. We discussed many lifestyle modifications today in depth, and Desiree Cooper will continue to work on decreasing saturated fats such as fatty red meat, butter and many fried foods. She will also increase vegetables and lean protein in her diet and continue to work on diet, exercise, and weight loss efforts. We will recheck labs in 3 months. Desiree Cooper agrees to follow up with our clinic in 2 to 3 weeks.  Vitamin D Deficiency Desiree Cooper was informed that low vitamin D levels contributes to fatigue and are associated with obesity, breast, and colon cancer. Desiree Cooper agrees to start prescription Vit D @50 ,000 IU every week #4 with no refills. She will follow up for routine testing of vitamin D, at least 2-3 times per year. She was informed  of the risk of over-replacement of vitamin D and agrees to not increase her dose unless she discusses this with [place] first. We will recheck labs in 3 months. Desiree Cooper agrees to follow up with our clinic in 2 to 3 weeks.  Pre-Diabetes Desiree Cooper will continue to work on weight loss, diet, exercise, and decreasing simple carbohydrates in her diet to help decrease the risk of diabetes. We dicussed metformin including benefits and risks. She was informed that eating too many simple carbohydrates or too many calories at one sitting increases the likelihood of GI side effects. Desiree Cooper declined metformin for now and a prescription was not written today. We will recheck labs in 3 months. Desiree Cooper agrees to follow up with our clinic in 2 to 3 weeks as directed to monitor her progress.  Obesity Desiree Cooper is currently in the action stage of change. As such, her goal is to continue with weight loss efforts She has agreed to follow the Category 2 plan Desiree Cooper has been instructed to work up to a goal of 150 minutes of combined cardio and strengthening exercise per week for weight loss and overall health benefits. We discussed the following Behavioral Modification Strategies today: increasing lean protein intake, decreasing simple carbohydrates  and work on meal planning and easy cooking plans   Desiree Cooper has agreed to follow up with our clinic in 2 to 3 weeks. She was informed of the importance of frequent follow up visits to maximize her success with intensive lifestyle modifications for her multiple health conditions.   OBESITY BEHAVIORAL INTERVENTION VISIT  Today's visit was # 2   Starting weight: 218 lbs Starting date: 10/09/17 Today's weight : 212 lbs  Today's date: 10/28/2017 Total lbs lost to date: 6    ASK: We discussed the diagnosis of obesity with Girard Cooter today and Desiree Cooper agreed to give [place] permission to discuss obesity behavioral modification therapy today.  ASSESS: Desiree Cooper  has the diagnosis of obesity and her  BMI today is 38.77 Desiree Cooper is in the action stage of change   ADVISE: Desiree Cooper was educated on the multiple health risks of obesity as well as the benefit of weight loss to improve her health. She was advised of the need for long term treatment and the importance of lifestyle modifications to improve her current health and to decrease her risk of future health problems.  AGREE: Multiple dietary modification options and treatment options were discussed and  Desiree Cooper agreed to follow the recommendations documented in the above note.  ARRANGE: Desiree Cooper was educated on the importance of frequent visits to treat obesity as outlined per CMS and USPSTF guidelines and agreed to schedule her next follow up appointment today.  I, Trixie Dredge, am acting as transcriptionist for Dennard Nip, MD  I have reviewed the above documentation for accuracy and completeness, and I agree with the above. -Dennard Nip, MD

## 2017-11-18 ENCOUNTER — Other Ambulatory Visit (INDEPENDENT_AMBULATORY_CARE_PROVIDER_SITE_OTHER): Payer: Self-pay | Admitting: Family Medicine

## 2017-11-18 DIAGNOSIS — E559 Vitamin D deficiency, unspecified: Secondary | ICD-10-CM

## 2017-11-19 ENCOUNTER — Ambulatory Visit (INDEPENDENT_AMBULATORY_CARE_PROVIDER_SITE_OTHER): Payer: Managed Care, Other (non HMO) | Admitting: Family Medicine

## 2017-11-19 ENCOUNTER — Encounter (INDEPENDENT_AMBULATORY_CARE_PROVIDER_SITE_OTHER): Payer: Self-pay | Admitting: Family Medicine

## 2017-11-19 VITALS — BP 122/78 | HR 66 | Temp 98.2°F | Ht 62.0 in | Wt 210.0 lb

## 2017-11-19 DIAGNOSIS — Z9189 Other specified personal risk factors, not elsewhere classified: Secondary | ICD-10-CM

## 2017-11-19 DIAGNOSIS — Z6838 Body mass index (BMI) 38.0-38.9, adult: Secondary | ICD-10-CM

## 2017-11-19 DIAGNOSIS — R7303 Prediabetes: Secondary | ICD-10-CM

## 2017-11-19 MED ORDER — METFORMIN HCL 500 MG PO TABS: 500.0000 mg | ORAL_TABLET | Freq: Every day | ORAL | 0 refills | Status: DC

## 2017-11-20 NOTE — Progress Notes (Signed)
Office: [phone]  /  Fax: [phone]   HPI:   Chief Complaint: OBESITY Desiree Cooper is here to discuss her progress with her obesity treatment plan. She is on the Category 2 plan and is following her eating plan approximately 75 % of the time. She states she is exercising 0 minutes 0 times per week. Desiree Cooper continues to do well with weight loss on her Category 2 plan. She states her hunger is not as controlled as it was previously. She requests a low calorie pumpkin recipe.  Her weight is 210 lb (95.3 kg) today and has had a weight loss of 2 pounds over a period of 3 weeks since her last visit. She has lost 8 lbs since starting treatment with [place].  Pre-Diabetes Desiree Cooper has a diagnosis of pre-diabetes based on her elevated Hgb A1c and was informed this puts her at greater risk of developing diabetes. She has been attempting to improve with diet but notes increased polyphagia. She is not taking metformin currently and continues to work on diet and exercise to decrease risk of diabetes. She denies nausea or hypoglycemia.  At risk for diabetes Desiree Cooper is at higher than average risk for developing diabetes due to her obesity and pre-diabetes. She currently denies polyuria or polydipsia.  ALLERGIES: Allergies  Allergen Reactions  . Penicillins Rash    MEDICATIONS: Current Outpatient Medications on File Prior to Visit  Medication Sig Dispense Refill  . budesonide-formoterol (SYMBICORT) 160-4.5 MCG/ACT inhaler Inhale 2 puffs into the lungs 2 (two) times daily.    . diphenhydrAMINE (BENADRYL) 25 MG tablet Take 25 mg by mouth at bedtime as needed.    . fluticasone furoate-vilanterol (BREO ELLIPTA) 100-25 MCG/INH AEPB Inhale 1 puff into the lungs daily. 90 each 1  . pramipexole (MIRAPEX) 0.125 MG tablet TAKE 1 TABLET (0.125 MG TOTAL) BY MOUTH 2 TO 3 HOURS BEFORE BEDTIME. 90 tablet 2  . sertraline (ZOLOFT) 100 MG tablet Take 1 tablet (100 mg total) by mouth daily. 90 tablet 2  . traZODone (DESYREL) 50  MG tablet TAKE 0.5-1 TABLETS (25-50 MG TOTAL) BY MOUTH AT BEDTIME AS NEEDED FOR SLEEP. 90 tablet 0  . Vitamin D, Ergocalciferol, (DRISDOL) 50000 units CAPS capsule Take 1 capsule (50,000 Units total) by mouth every 7 (seven) days. 4 capsule 0   No current facility-administered medications on file prior to visit.     PAST MEDICAL HISTORY: Past Medical History:  Diagnosis Date  . Asthma    inhaler use for 4 years  . Back pain   . Chest pain   . Decreased libido   . Depression   . Dyspnea   . Fatigue   . Fatty liver   . Hot flashes   . Hx of adenomatous polyp of colon 10/29/2016  . Hypertension   . Joint pain   . Labial abscess   . Leg edema   . Prediabetes   . Sleep apnea     PAST SURGICAL HISTORY: Past Surgical History:  Procedure Laterality Date  . TONSILLECTOMY    . WISDOM TOOTH EXTRACTION      SOCIAL HISTORY: Social History   Tobacco Use  . Smoking status: Former Smoker    Packs/day: 0.50    Years: 20.00    Pack years: 10.00  . Smokeless tobacco: Never Used  Substance Use Topics  . Alcohol use: Yes    Comment: socially- 2 time month per pt-wine  . Drug use: No    FAMILY HISTORY: Family History  Problem Relation  Age of Onset  . COPD Mother   . Arthritis Mother   . Depression Mother   . Diabetes Mother   . Hypertension Mother   . Hyperlipidemia Mother   . Thyroid disease Mother   . Alcohol abuse Father        deceased age 73  . Hyperlipidemia Father   . Hypertension Father   . Liver disease Father   . Rheum arthritis Other        grandfather, not indicated if paternal or maternal   . Colon cancer Neg Hx     ROS: Review of Systems  Constitutional: Positive for weight loss.  Gastrointestinal: Negative for nausea.  Genitourinary: Negative for frequency.  Endo/Heme/Allergies: Negative for polydipsia.       Negative hypoglycemia Positive polyphagia    PHYSICAL EXAM: Blood pressure 122/78, pulse 66, temperature 98.2 F (36.8 C), temperature  source Oral, height 5\' 2"  (1.575 m), weight 210 lb (95.3 kg), last menstrual period 05/04/2010, SpO2 98 %. Body mass index is 38.41 kg/m. Physical Exam  Constitutional: She is oriented to person, place, and time. She appears well-developed and well-nourished.  Cardiovascular: Normal rate.  Pulmonary/Chest: Effort normal.  Musculoskeletal: Normal range of motion.  Neurological: She is oriented to person, place, and time.  Skin: Skin is warm and dry.  Psychiatric: She has a normal mood and affect. Her behavior is normal.  Vitals reviewed.   RECENT LABS AND TESTS: BMET    Component Value Date/Time   NA 140 10/09/2017 1008   K 4.7 10/09/2017 1008   CL 101 10/09/2017 1008   CO2 21 10/09/2017 1008   GLUCOSE 96 10/09/2017 1008   GLUCOSE 85 07/26/2009 1440   BUN 12 10/09/2017 1008   CREATININE 0.84 10/09/2017 1008   CALCIUM 9.7 10/09/2017 1008   GFRNONAA 77 10/09/2017 1008   GFRAA 89 10/09/2017 1008   Lab Results  Component Value Date   HGBA1C 6.0 (H) 10/09/2017   HGBA1C 6.0 (H) 07/07/2017   Lab Results  Component Value Date   INSULIN 20.4 10/09/2017   CBC    Component Value Date/Time   WBC 9.1 10/09/2017 1008   WBC 11.4 (H) 07/26/2009 1440   RBC 5.14 10/09/2017 1008   RBC 4.82 07/26/2009 1440   HGB 15.0 10/09/2017 1008   HCT 43.6 10/09/2017 1008   PLT 332 07/07/2017 1800   MCV 85 10/09/2017 1008   MCH 29.2 10/09/2017 1008   MCHC 34.4 10/09/2017 1008   MCHC 34.6 07/26/2009 1440   RDW 13.5 10/09/2017 1008   LYMPHSABS 2.3 10/09/2017 1008   MONOABS 0.8 07/26/2009 1440   EOSABS 0.4 10/09/2017 1008   BASOSABS 0.1 10/09/2017 1008   Iron/TIBC/Ferritin/ %Sat No results found for: IRON, TIBC, FERRITIN, IRONPCTSAT Lipid Panel     Component Value Date/Time   CHOL 226 (H) 10/09/2017 1008   TRIG 150 (H) 10/09/2017 1008   HDL 42 10/09/2017 1008   CHOLHDL 5.1 (H) 07/07/2017 1800   CHOLHDL 5 07/26/2009 1440   VLDL 41.2 (H) 07/26/2009 1440   LDLCALC 154 (H) 10/09/2017  1008   LDLDIRECT 163.5 07/26/2009 1440   Hepatic Function Panel     Component Value Date/Time   PROT 7.3 10/09/2017 1008   ALBUMIN 4.7 10/09/2017 1008   AST 33 10/09/2017 1008   ALT 38 (H) 10/09/2017 1008   ALKPHOS 80 10/09/2017 1008   BILITOT 0.3 10/09/2017 1008   BILIDIR 0.1 07/26/2009 1440      Component Value Date/Time   TSH  1.500 10/09/2017 1008   TSH 1.260 07/07/2017 1800   TSH 1.462 11/01/2011 1431    ASSESSMENT AND PLAN: Pre-diabetes - Plan: metFORMIN (GLUCOPHAGE) 500 MG tablet  At risk for diabetes mellitus  Class 2 severe obesity with serious comorbidity and body mass index (BMI) of 38.0 to 38.9 in adult, unspecified obesity type ([place])  PLAN:  Pre-Diabetes Desiree Cooper will continue diet prescription and will continue to work on weight loss, exercise, and decreasing simple carbohydrates in her diet to help decrease the risk of diabetes. We dicussed metformin including benefits and risks. She was informed that eating too many simple carbohydrates or too many calories at one sitting increases the likelihood of GI side effects. Desiree Cooper agrees to start metformin 500 mg q AM #30 with no refills. Desiree Cooper agrees to follow up with our clinic in 2 to 3 weeks as directed to monitor her progress.  Diabetes risk counselling Desiree Cooper was given extended (15 minutes) diabetes prevention counseling today. She is 57 y.o. female and has risk factors for diabetes including obesity and pre-diabetes. We discussed intensive lifestyle modifications today with an emphasis on weight loss as well as increasing exercise and decreasing simple carbohydrates in her diet.  Obesity Desiree Cooper is currently in the action stage of change. As such, her goal is to continue with weight loss efforts She has agreed to follow the Category 2 plan Desiree Cooper has been instructed to work up to a goal of 150 minutes of combined cardio and strengthening exercise per week for weight loss and overall health benefits. We discussed the  following Behavioral Modification Strategies today: increasing lean protein intake, decreasing simple carbohydrates, emotional eating strategies, and better snacking choices Mini pumpkin pie sugar free recipe was given.  Desiree Cooper has agreed to follow up with our clinic in 2 to 3 weeks. She was informed of the importance of frequent follow up visits to maximize her success with intensive lifestyle modifications for her multiple health conditions.   OBESITY BEHAVIORAL INTERVENTION VISIT  Today's visit was # 3   Starting weight: 218 lbs Starting date: 10/09/17 Today's weight : 210 lbs  Today's date: 11/19/2017 Total lbs lost to date: 8    ASK: We discussed the diagnosis of obesity with Girard Cooter today and Desiree Cooper agreed to give [place] permission to discuss obesity behavioral modification therapy today.  ASSESS: Desiree Cooper has the diagnosis of obesity and her BMI today is 38.4 Desiree Cooper is in the action stage of change   ADVISE: Desiree Cooper was educated on the multiple health risks of obesity as well as the benefit of weight loss to improve her health. She was advised of the need for long term treatment and the importance of lifestyle modifications to improve her current health and to decrease her risk of future health problems.  AGREE: Multiple dietary modification options and treatment options were discussed and  Desiree Cooper agreed to follow the recommendations documented in the above note.  ARRANGE: Desiree Cooper was educated on the importance of frequent visits to treat obesity as outlined per CMS and USPSTF guidelines and agreed to schedule her next follow up appointment today.  I, Trixie Dredge, am acting as transcriptionist for Dennard Nip, MD  I have reviewed the above documentation for accuracy and completeness, and I agree with the above. -Dennard Nip, MD

## 2017-11-24 ENCOUNTER — Other Ambulatory Visit: Payer: Self-pay | Admitting: Family Medicine

## 2017-11-25 NOTE — Telephone Encounter (Signed)
Last seen 07/11/17

## 2017-12-01 ENCOUNTER — Telehealth (INDEPENDENT_AMBULATORY_CARE_PROVIDER_SITE_OTHER): Payer: Self-pay | Admitting: Family Medicine

## 2017-12-01 NOTE — Telephone Encounter (Signed)
Jessica w/[facility] called, stating that the patient needs to get her Metformin filled from a local pharmacy due to her insurance.  She did not know the patient's local pharmacy.  [phone]

## 2017-12-03 NOTE — Telephone Encounter (Signed)
Sent the prescription in to [facility] at the patient request. [place], [place]

## 2017-12-11 ENCOUNTER — Ambulatory Visit (INDEPENDENT_AMBULATORY_CARE_PROVIDER_SITE_OTHER): Payer: Managed Care, Other (non HMO) | Admitting: Family Medicine

## 2017-12-11 VITALS — BP 136/80 | HR 69 | Temp 97.9°F | Ht 62.0 in | Wt 210.0 lb

## 2017-12-11 DIAGNOSIS — E559 Vitamin D deficiency, unspecified: Secondary | ICD-10-CM | POA: Diagnosis not present

## 2017-12-11 DIAGNOSIS — F3289 Other specified depressive episodes: Secondary | ICD-10-CM

## 2017-12-11 DIAGNOSIS — Z6838 Body mass index (BMI) 38.0-38.9, adult: Secondary | ICD-10-CM

## 2017-12-11 DIAGNOSIS — R7303 Prediabetes: Secondary | ICD-10-CM | POA: Diagnosis not present

## 2017-12-11 DIAGNOSIS — Z9189 Other specified personal risk factors, not elsewhere classified: Secondary | ICD-10-CM

## 2017-12-15 MED ORDER — METFORMIN HCL 500 MG PO TABS: 500.0000 mg | ORAL_TABLET | Freq: Every day | ORAL | 0 refills | Status: DC

## 2017-12-15 MED ORDER — VITAMIN D (ERGOCALCIFEROL) 1.25 MG (50000 UNIT) PO CAPS: 50000.0000 [IU] | ORAL_CAPSULE | ORAL | 0 refills | Status: DC

## 2017-12-16 ENCOUNTER — Encounter (INDEPENDENT_AMBULATORY_CARE_PROVIDER_SITE_OTHER): Payer: Self-pay | Admitting: Family Medicine

## 2017-12-16 NOTE — Progress Notes (Unsigned)
Office: [phone]  /  Fax: [phone]  Date: December 23, 2017 Time Seen: *** Duration: *** Provider: Glennie Isle, PsyD Type of Session: Intake for Individual Therapy   Informed Consent: The provider's role was explained to [name]. The provider reviewed and discussed issues of confidentiality, privacy, and limits therein. In addition to verbal informed consent, written informed consent for psychological services was obtained from Desiree Cooper prior to the initial intake interview. Written consent included information concerning the practice, financial arrangements, and confidentiality and patients' rights. Since the clinic is not a 24/7 crisis center, mental health emergency resources were shared and a handout was provided. The provider further explained the utilization of MyChart, e-mail, voicemail, and/or other messaging systems can be utilized for non-emergency reasons. Desiree Cooper verbally acknowledged understanding of the aforementioned, and agreed to use mental health emergency resources discussed if needed. Moreover, Desiree Cooper agreed information may be shared with other [facility] providers as needed for coordination of care, and written consent was obtained.   Chief Complaint: Desiree Cooper was referred by Dr. Dennard Nip. Per the note for the visit with Dr. Dennard Nip on December 11, 2017,  Desiree Cooper was asked to complete a questionnaire assessing various behaviors related to emotional eating. Desiree Cooper endorsed the following: {gbmoodandfood:21755}.  HPI: Per the note for the initial visit with Dr. Dennard Nip on October 09, 2017, Desiree Cooper started gaining weight after she stopped smoking gradually and her heaviest weight ever was 235 pounds. During the initial point with Dr. Leafy Ro, Desiree Cooper reported experiencing the following: significant food cravings issues; snacking frequently in the evenings; waking up frequently in the middle of the night to eat; skipping meals frequently;  frequently drinking liquids with calories; frequently making poor food choices; frequently eating larger portions than normal; and struggling with emotional eating.  Mental Status Examination: Desiree Cooper arrived on time for the appointment. She presented as appropriately dressed and groomed. Desiree Cooper appeared her stated age and demonstrated adequate orientation to time, place, person, and purpose of the appointment. She also demonstrated appropriate eye contact. No psychomotor abnormalities or behavioral peculiarities noted. Her mood was {gbmood:21757} with congruent affect. Her thought processes were logical, linear, and goal-directed. No hallucinations, delusions, bizarre thinking or behavior reported or observed. Judgment, insight, and impulse control appeared to be grossly intact. There was no evidence of paraphasias (i.e., errors in speech, gross mispronunciations, and word substitutions), repetition deficits, or disturbances in volume or prosody (i.e., rhythm and intonation). There was no evidence of attention or memory impairments. Desiree Cooper denied current suicidal and homicidal ideation, plan, and intent.   The Montreal Cognitive Assessment (MoCA) was administered. The MoCA assesses different cognitive domains: attention and concentration, executive functions, memory, language, visuoconstructional skills, conceptual thinking, calculations, and orientation. Desiree Cooper received *** out of 30 points possible on the MoCA, which is noted in the *** range. ***[A point was added to the total score due to years of formal education being 12 years or fewer.] The following points were lost: ***  Family & Psychosocial History: ***  Medical History: ***  Mental Health History: ***  Desiree Cooper reported experiencing the following: {gbintakesxs:21966}  Desiree Cooper denied experiencing the following: {gbsxs:21965}  Structured Assessment Results: The Patient Health Questionnaire-9 (PHQ-9) is a self-report measure that assesses symptoms  and severity of depression over the course of the last two weeks. Desiree Cooper obtained a score of *** suggesting {GBPHQ9SEVERITY:21752}. Desiree Cooper finds the endorsed symptoms to be {gbphq9difficulty:21754}.    The Generalized Anxiety Disorder-7 (GAD-7) is a brief self-report measure that assesses  symptoms of anxiety over the course of the last two weeks. Desiree Cooper obtained a score of *** suggesting {gbgad7severity:21753}.    Interventions: A chart review was conducted prior to the clinical intake interview. The MoCA, PHQ-9, and GAD-7 were administered and a clinical intake interview was completed. In addition, Desiree Cooper was asked to complete a Mood and Food questionnaire to assess various behaviors related to emotional eating. Throughout session, empathic reflections and validation was provided. Continuing treatment with this provider was discussed and a treatment goal was established. Psychoeducation regarding emotional versus physical hunger was provided. Desiree Cooper was given a handout to utilize between now and the next appointment to increase awareness of hunger patterns and subsequent eating. ***  Provisional DSM-5 Diagnosis: ***  Plan: Desiree Cooper expressed understanding and agreement with the initial treatment plan of care. She appears able and willing to participate as evidenced by collaboration on a treatment goal, engagement in reciprocal conversation, and asking questions as needed for clarification. The next appointment will be scheduled in {gbweeks:21758}. The following treatment goal was established: {gbtxgoals:21759}. For the aforementioned goal, Desiree Cooper can benefit from biweekly sessions that are brief in duration for approximately four to six sessions.

## 2017-12-16 NOTE — Progress Notes (Signed)
Office: [phone]  /  Fax: [phone]   HPI:   Chief Complaint: OBESITY Desiree Cooper is here to discuss her progress with her obesity treatment plan. She is on the  follow the Category 2 plan and is following her eating plan approximately 30-40 % of the time. She states she is exercising 0 minutes 0 times per week. Desiree Cooper did well with maintaining her weight but has not had cravings and temptations and is frustrated about how often she gives into them.  Her weight is 210 lb (95.3 kg) today and maintained since her last visit. She has lost 8 lbs since starting treatment with [place].  Pre-Diabetes Desiree Cooper has a diagnosis of prediabetes based on her elevated HgA1c and was informed this puts her at greater risk of developing diabetes. She is taking metformin currently and continues to work on diet and exercise to decrease risk of diabetes. She denies nausea or hypoglycemia.  Vitamin D deficiency Desiree Cooper has a diagnosis of vitamin D deficiency. She is currently taking vit D and denies nausea, vomiting or muscle weakness.  Depression with emotional eating behaviors Desiree Cooper is struggling with emotional eating and using food for comfort to the extent that it is negatively impacting her health. She often snacks when she is not hungry. Desiree Cooper sometimes feels she is out of control and then feels guilty that she made poor food choices. She has been working on behavior modification techniques to help reduce her emotional eating and has been somewhat successful. She shows no sign of suicidal or homicidal ideations. She does note an increase in emotional eating, worse in the last week and is frustrated that she doesn't have better control.   Depression screen [facility] 2/9 10/09/2017 07/11/2017 11/14/2016 09/07/2016 07/30/2016  Decreased Interest 3 0 1 0 0  Down, Depressed, Hopeless 3 0 2 0 0  PHQ - 2 Score 6 0 3 0 0  Altered sleeping 3 - 0 - -  Tired, decreased energy 3 - 1 - -  Change in appetite 3 - 1 - -  Feeling bad or  failure about yourself  3 - 0 - -  Trouble concentrating 3 - 1 - -  Moving slowly or fidgety/restless 2 - 0 - -  Suicidal thoughts 0 - 0 - -  PHQ-9 Score 23 - 6 - -  Difficult doing work/chores Extremely dIfficult - Not difficult at all - -   At risk for osteopenia and osteoporosis Desiree Cooper is at higher risk of osteopenia and osteoporosis due to vitamin D deficiency.    ALLERGIES: Allergies  Allergen Reactions  . Penicillins Rash    MEDICATIONS: Current Outpatient Medications on File Prior to Visit  Medication Sig Dispense Refill  . budesonide-formoterol (SYMBICORT) 160-4.5 MCG/ACT inhaler Inhale 2 puffs into the lungs 2 (two) times daily.    . diphenhydrAMINE (BENADRYL) 25 MG tablet Take 25 mg by mouth at bedtime as needed.    . fluticasone furoate-vilanterol (BREO ELLIPTA) 100-25 MCG/INH AEPB Inhale 1 puff into the lungs daily. 90 each 1  . pramipexole (MIRAPEX) 0.125 MG tablet TAKE 1 TABLET (0.125 MG TOTAL) BY MOUTH 2 TO 3 HOURS BEFORE BEDTIME. 90 tablet 2  . sertraline (ZOLOFT) 100 MG tablet TAKE 1 TABLET BY MOUTH EVERY DAY 90 tablet 0  . traZODone (DESYREL) 50 MG tablet TAKE 0.5-1 TABLETS (25-50 MG TOTAL) BY MOUTH AT BEDTIME AS NEEDED FOR SLEEP. 90 tablet 0   No current facility-administered medications on file prior to visit.     PAST MEDICAL  HISTORY: Past Medical History:  Diagnosis Date  . Asthma    inhaler use for 4 years  . Back pain   . Chest pain   . Decreased libido   . Depression   . Dyspnea   . Fatigue   . Fatty liver   . Hot flashes   . Hx of adenomatous polyp of colon 10/29/2016  . Hypertension   . Joint pain   . Labial abscess   . Leg edema   . Prediabetes   . Sleep apnea     PAST SURGICAL HISTORY: Past Surgical History:  Procedure Laterality Date  . TONSILLECTOMY    . WISDOM TOOTH EXTRACTION      SOCIAL HISTORY: Social History   Tobacco Use  . Smoking status: Former Smoker    Packs/day: 0.50    Years: 20.00    Pack years: 10.00  .  Smokeless tobacco: Never Used  Substance Use Topics  . Alcohol use: Yes    Comment: socially- 2 time month per pt-wine  . Drug use: No    FAMILY HISTORY: Family History  Problem Relation Age of Onset  . COPD Mother   . Arthritis Mother   . Depression Mother   . Diabetes Mother   . Hypertension Mother   . Hyperlipidemia Mother   . Thyroid disease Mother   . Alcohol abuse Father        deceased age 56  . Hyperlipidemia Father   . Hypertension Father   . Liver disease Father   . Rheum arthritis Other        grandfather, not indicated if paternal or maternal   . Colon cancer Neg Hx     ROS: Review of Systems  All other systems reviewed and are negative.   PHYSICAL EXAM: Blood pressure 136/80, pulse 69, temperature 97.9 F (36.6 C), temperature source Oral, height 5\' 2"  (1.575 m), weight 210 lb (95.3 kg), last menstrual period 05/04/2010, SpO2 99 %. Body mass index is 38.41 kg/m. Physical Exam  Constitutional: She is oriented to person, place, and time. She appears well-developed and well-nourished.  Eyes: Pupils are equal, round, and reactive to light.  Neck: Normal range of motion.  Pulmonary/Chest: Effort normal.  Musculoskeletal: Normal range of motion.  Neurological: She is alert and oriented to person, place, and time.  Skin: Skin is warm and dry.  Psychiatric: She has a normal mood and affect. Her behavior is normal.  Vitals reviewed.   RECENT LABS AND TESTS: BMET    Component Value Date/Time   NA 140 10/09/2017 1008   K 4.7 10/09/2017 1008   CL 101 10/09/2017 1008   CO2 21 10/09/2017 1008   GLUCOSE 96 10/09/2017 1008   GLUCOSE 85 07/26/2009 1440   BUN 12 10/09/2017 1008   CREATININE 0.84 10/09/2017 1008   CALCIUM 9.7 10/09/2017 1008   GFRNONAA 77 10/09/2017 1008   GFRAA 89 10/09/2017 1008   Lab Results  Component Value Date   HGBA1C 6.0 (H) 10/09/2017   HGBA1C 6.0 (H) 07/07/2017   Lab Results  Component Value Date   INSULIN 20.4 10/09/2017    CBC    Component Value Date/Time   WBC 9.1 10/09/2017 1008   WBC 11.4 (H) 07/26/2009 1440   RBC 5.14 10/09/2017 1008   RBC 4.82 07/26/2009 1440   HGB 15.0 10/09/2017 1008   HCT 43.6 10/09/2017 1008   PLT 332 07/07/2017 1800   MCV 85 10/09/2017 1008   MCH 29.2 10/09/2017 1008  MCHC 34.4 10/09/2017 1008   MCHC 34.6 07/26/2009 1440   RDW 13.5 10/09/2017 1008   LYMPHSABS 2.3 10/09/2017 1008   MONOABS 0.8 07/26/2009 1440   EOSABS 0.4 10/09/2017 1008   BASOSABS 0.1 10/09/2017 1008   Iron/TIBC/Ferritin/ %Sat No results found for: IRON, TIBC, FERRITIN, IRONPCTSAT Lipid Panel     Component Value Date/Time   CHOL 226 (H) 10/09/2017 1008   TRIG 150 (H) 10/09/2017 1008   HDL 42 10/09/2017 1008   CHOLHDL 5.1 (H) 07/07/2017 1800   CHOLHDL 5 07/26/2009 1440   VLDL 41.2 (H) 07/26/2009 1440   LDLCALC 154 (H) 10/09/2017 1008   LDLDIRECT 163.5 07/26/2009 1440   Hepatic Function Panel     Component Value Date/Time   PROT 7.3 10/09/2017 1008   ALBUMIN 4.7 10/09/2017 1008   AST 33 10/09/2017 1008   ALT 38 (H) 10/09/2017 1008   ALKPHOS 80 10/09/2017 1008   BILITOT 0.3 10/09/2017 1008   BILIDIR 0.1 07/26/2009 1440      Component Value Date/Time   TSH 1.500 10/09/2017 1008   TSH 1.260 07/07/2017 1800   TSH 1.462 11/01/2011 1431    ASSESSMENT AND PLAN: Pre-diabetes - Plan: metFORMIN (GLUCOPHAGE) 500 MG tablet  Vitamin D deficiency - Plan: Vitamin D, Ergocalciferol, (DRISDOL) 50000 units CAPS capsule  Other depression - with emotional eating  At risk for osteoporosis  Class 2 severe obesity with serious comorbidity and body mass index (BMI) of 38.0 to 38.9 in adult, unspecified obesity type ([place])  PLAN: Pre-Diabetes Desiree Cooper will continue to work on weight loss, exercise, and decreasing simple carbohydrates in her diet to help decrease the risk of diabetes. We dicussed metformin including benefits and risks. She was informed that eating too many simple carbohydrates or too  many calories at one sitting increases the likelihood of GI side effects. Desiree Cooper requested metformin for now and a prescription was written today. Desiree Cooper agreed to follow up with [place] as directed to monitor her progress.  Vitamin D Deficiency Desiree Cooper was informed that low vitamin D levels contributes to fatigue and are associated with obesity, breast, and colon cancer. She agrees to continue to take prescription Vit D @50 ,000 IU every week and will follow up for routine testing of vitamin D, at least 2-3 times per year. She was informed of the risk of over-replacement of vitamin D and agrees to not increase her dose unless she discusses this with [place] first.  Depression with Emotional Eating Behaviors We discussed behavior modification techniques today to help Desiree Cooper deal with her emotional eating and depression. She has agreed to take Wellbutrin SR 150 mg qd and agreed to follow up as directed. Will refer her to Dr Mallie Mussel and continue the Zoloft at this time. We dicussed cognitive behavorial therapy to help with decreasing emotional eating.   At risk for osteopenia and osteoporosis Desiree Cooper was given extended  (15 minutes) osteoporosis prevention counseling today. Desiree Cooper is at risk for osteopenia and osteoporsis due to her vitamin D deficiency. She was encouraged to take her vitamin D and follow her higher calcium diet and increase strengthening exercise to help strengthen her bones and decrease her risk of osteopenia and osteoporosis.  Obesity Desiree Cooper is currently in the action stage of change. As such, her goal is to continue with weight loss efforts She has agreed to follow the Category 2 plan and journaling one meal a day at 400 calories.  Desiree Cooper has been instructed to work up to a goal of 150 minutes of combined  cardio and strengthening exercise per week for weight loss and overall health benefits. We discussed the following Behavioral Modification Stratagies today: increasing lean protein intake, decreasing  simple carbohydrates , work on meal planning and easy cooking plans and emotional eating strategies   Desiree Cooper has agreed to follow up with our clinic in 2 weeks. She was informed of the importance of frequent follow up visits to maximize her success with intensive lifestyle modifications for her multiple health conditions.   OBESITY BEHAVIORAL INTERVENTION VISIT  Today's visit was # 4   Starting weight: 218 lbs Starting date: 10/09/17 Today's weight : Weight: 210 lb (95.3 kg)  Today's date: 12/11/17 Total lbs lost to date: 8    ASK: We discussed the diagnosis of obesity with Girard Cooter today and Desiree Cooper agreed to give [place] permission to discuss obesity behavioral modification therapy today.  ASSESS: Desiree Cooper has the diagnosis of obesity and her BMI today is 38.4 Desiree Cooper is in the action stage of change   ADVISE: Desiree Cooper was educated on the multiple health risks of obesity as well as the benefit of weight loss to improve her health. She was advised of the need for long term treatment and the importance of lifestyle modifications to improve her current health and to decrease her risk of future health problems.  AGREE: Multiple dietary modification options and treatment options were discussed and  Desiree Cooper agreed to follow the recommendations documented in the above note.  ARRANGE: Desiree Cooper was educated on the importance of frequent visits to treat obesity as outlined per CMS and USPSTF guidelines and agreed to schedule her next follow up appointment today.  I, April Moore, am acting as Location manager for Dr Dennard Nip.  I have reviewed the above documentation for accuracy and completeness, and I agree with the above. -Dennard Nip, MD

## 2017-12-23 ENCOUNTER — Ambulatory Visit (INDEPENDENT_AMBULATORY_CARE_PROVIDER_SITE_OTHER): Payer: Managed Care, Other (non HMO) | Admitting: Psychology

## 2017-12-23 ENCOUNTER — Encounter (INDEPENDENT_AMBULATORY_CARE_PROVIDER_SITE_OTHER): Payer: Self-pay

## 2017-12-26 ENCOUNTER — Other Ambulatory Visit (INDEPENDENT_AMBULATORY_CARE_PROVIDER_SITE_OTHER): Payer: Self-pay | Admitting: Family Medicine

## 2017-12-26 DIAGNOSIS — R7303 Prediabetes: Secondary | ICD-10-CM

## 2017-12-31 ENCOUNTER — Ambulatory Visit (INDEPENDENT_AMBULATORY_CARE_PROVIDER_SITE_OTHER): Payer: Managed Care, Other (non HMO) | Admitting: Family Medicine

## 2017-12-31 VITALS — BP 130/82 | HR 81 | Temp 98.1°F | Ht 62.0 in | Wt 206.0 lb

## 2017-12-31 DIAGNOSIS — Z6837 Body mass index (BMI) 37.0-37.9, adult: Secondary | ICD-10-CM | POA: Diagnosis not present

## 2017-12-31 DIAGNOSIS — Z9189 Other specified personal risk factors, not elsewhere classified: Secondary | ICD-10-CM

## 2017-12-31 DIAGNOSIS — R7303 Prediabetes: Secondary | ICD-10-CM | POA: Diagnosis not present

## 2017-12-31 MED ORDER — METFORMIN HCL 500 MG PO TABS: 500.0000 mg | ORAL_TABLET | Freq: Every day | ORAL | 0 refills | Status: DC

## 2018-01-01 ENCOUNTER — Encounter (INDEPENDENT_AMBULATORY_CARE_PROVIDER_SITE_OTHER): Payer: Self-pay | Admitting: Family Medicine

## 2018-01-01 ENCOUNTER — Encounter (INDEPENDENT_AMBULATORY_CARE_PROVIDER_SITE_OTHER): Payer: Self-pay

## 2018-01-01 NOTE — Progress Notes (Signed)
Office: [phone]  /  Fax: [phone]   HPI:   Chief Complaint: OBESITY Desiree Cooper is here to discuss her progress with her obesity treatment plan. She is on the Category 2 plan with journaling one meal a day at 400 calories and is following her eating plan approximately 40 % of the time. She states she is exercising 0 minutes 0 times per week. Desiree Cooper continues to do well with weight loss. She is struggling with work place temptations, but she is doing better with portion control.  Her weight is 206 lb (93.4 kg) today and has had a weight loss of 4 pounds over a period of 3 weeks since her last visit. She has lost 12 lbs since starting treatment with [place].  Pre-Diabetes Desiree Cooper has a diagnosis of pre-diabetes based on her elevated Hgb A1c and was informed this puts her at greater risk of developing diabetes. She is stable on metformin and continues to work on diet and exercise to decrease risk of diabetes. She denies nausea, vomiting, or hypoglycemia.  At risk for diabetes Desiree Cooper is at higher than average risk for developing diabetes due to her obesity and pre-diabetes. She currently denies polyuria or polydipsia.  ALLERGIES: Allergies  Allergen Reactions  . Penicillins Rash    MEDICATIONS: Current Outpatient Medications on File Prior to Visit  Medication Sig Dispense Refill  . budesonide-formoterol (SYMBICORT) 160-4.5 MCG/ACT inhaler Inhale 2 puffs into the lungs 2 (two) times daily.    . diphenhydrAMINE (BENADRYL) 25 MG tablet Take 25 mg by mouth at bedtime as needed.    . fluticasone furoate-vilanterol (BREO ELLIPTA) 100-25 MCG/INH AEPB Inhale 1 puff into the lungs daily. 90 each 1  . pramipexole (MIRAPEX) 0.125 MG tablet TAKE 1 TABLET (0.125 MG TOTAL) BY MOUTH 2 TO 3 HOURS BEFORE BEDTIME. 90 tablet 2  . sertraline (ZOLOFT) 100 MG tablet TAKE 1 TABLET BY MOUTH EVERY DAY 90 tablet 0  . traZODone (DESYREL) 50 MG tablet TAKE 0.5-1 TABLETS (25-50 MG TOTAL) BY MOUTH AT BEDTIME AS NEEDED FOR  SLEEP. 90 tablet 0  . Vitamin D, Ergocalciferol, (DRISDOL) 50000 units CAPS capsule Take 1 capsule (50,000 Units total) by mouth every 7 (seven) days. 4 capsule 0   No current facility-administered medications on file prior to visit.     PAST MEDICAL HISTORY: Past Medical History:  Diagnosis Date  . Asthma    inhaler use for 4 years  . Back pain   . Chest pain   . Decreased libido   . Depression   . Dyspnea   . Fatigue   . Fatty liver   . Hot flashes   . Hx of adenomatous polyp of colon 10/29/2016  . Hypertension   . Joint pain   . Labial abscess   . Leg edema   . Prediabetes   . Sleep apnea     PAST SURGICAL HISTORY: Past Surgical History:  Procedure Laterality Date  . TONSILLECTOMY    . WISDOM TOOTH EXTRACTION      SOCIAL HISTORY: Social History   Tobacco Use  . Smoking status: Former Smoker    Packs/day: 0.50    Years: 20.00    Pack years: 10.00  . Smokeless tobacco: Never Used  Substance Use Topics  . Alcohol use: Yes    Comment: socially- 2 time month per pt-wine  . Drug use: No    FAMILY HISTORY: Family History  Problem Relation Age of Onset  . COPD Mother   . Arthritis Mother   .  Depression Mother   . Diabetes Mother   . Hypertension Mother   . Hyperlipidemia Mother   . Thyroid disease Mother   . Alcohol abuse Father        deceased age 75  . Hyperlipidemia Father   . Hypertension Father   . Liver disease Father   . Rheum arthritis Other        grandfather, not indicated if paternal or maternal   . Colon cancer Neg Hx     ROS: Review of Systems  Constitutional: Positive for weight loss.  Gastrointestinal: Negative for nausea and vomiting.  Genitourinary: Negative for frequency.  Endo/Heme/Allergies: Negative for polydipsia.       Negative hypoglycemia    PHYSICAL EXAM: Blood pressure 130/82, pulse 81, temperature 98.1 F (36.7 C), temperature source Oral, height 5\' 2"  (1.575 m), weight 206 lb (93.4 kg), last menstrual period  05/04/2010, SpO2 98 %. Body mass index is 37.68 kg/m. Physical Exam  Constitutional: She is oriented to person, place, and time. She appears well-developed and well-nourished.  Cardiovascular: Normal rate.  Pulmonary/Chest: Effort normal.  Musculoskeletal: Normal range of motion.  Neurological: She is oriented to person, place, and time.  Skin: Skin is warm and dry.  Psychiatric: She has a normal mood and affect. Her behavior is normal.  Vitals reviewed.   RECENT LABS AND TESTS: BMET    Component Value Date/Time   NA 140 10/09/2017 1008   K 4.7 10/09/2017 1008   CL 101 10/09/2017 1008   CO2 21 10/09/2017 1008   GLUCOSE 96 10/09/2017 1008   GLUCOSE 85 07/26/2009 1440   BUN 12 10/09/2017 1008   CREATININE 0.84 10/09/2017 1008   CALCIUM 9.7 10/09/2017 1008   GFRNONAA 77 10/09/2017 1008   GFRAA 89 10/09/2017 1008   Lab Results  Component Value Date   HGBA1C 6.0 (H) 10/09/2017   HGBA1C 6.0 (H) 07/07/2017   Lab Results  Component Value Date   INSULIN 20.4 10/09/2017   CBC    Component Value Date/Time   WBC 9.1 10/09/2017 1008   WBC 11.4 (H) 07/26/2009 1440   RBC 5.14 10/09/2017 1008   RBC 4.82 07/26/2009 1440   HGB 15.0 10/09/2017 1008   HCT 43.6 10/09/2017 1008   PLT 332 07/07/2017 1800   MCV 85 10/09/2017 1008   MCH 29.2 10/09/2017 1008   MCHC 34.4 10/09/2017 1008   MCHC 34.6 07/26/2009 1440   RDW 13.5 10/09/2017 1008   LYMPHSABS 2.3 10/09/2017 1008   MONOABS 0.8 07/26/2009 1440   EOSABS 0.4 10/09/2017 1008   BASOSABS 0.1 10/09/2017 1008   Iron/TIBC/Ferritin/ %Sat No results found for: IRON, TIBC, FERRITIN, IRONPCTSAT Lipid Panel     Component Value Date/Time   CHOL 226 (H) 10/09/2017 1008   TRIG 150 (H) 10/09/2017 1008   HDL 42 10/09/2017 1008   CHOLHDL 5.1 (H) 07/07/2017 1800   CHOLHDL 5 07/26/2009 1440   VLDL 41.2 (H) 07/26/2009 1440   LDLCALC 154 (H) 10/09/2017 1008   LDLDIRECT 163.5 07/26/2009 1440   Hepatic Function Panel     Component  Value Date/Time   PROT 7.3 10/09/2017 1008   ALBUMIN 4.7 10/09/2017 1008   AST 33 10/09/2017 1008   ALT 38 (H) 10/09/2017 1008   ALKPHOS 80 10/09/2017 1008   BILITOT 0.3 10/09/2017 1008   BILIDIR 0.1 07/26/2009 1440      Component Value Date/Time   TSH 1.500 10/09/2017 1008   TSH 1.260 07/07/2017 1800   TSH 1.462 11/01/2011 1431  ASSESSMENT AND PLAN: Pre-diabetes - Plan: metFORMIN (GLUCOPHAGE) 500 MG tablet  At risk for diabetes mellitus  Class 2 severe obesity with serious comorbidity and body mass index (BMI) of 37.0 to 37.9 in adult, unspecified obesity type ([place])  PLAN:  Pre-Diabetes Desiree Cooper will continue to work on weight loss, diet, exercise, and decreasing simple carbohydrates in her diet to help decrease the risk of diabetes. We dicussed metformin including benefits and risks. She was informed that eating too many simple carbohydrates or too many calories at one sitting increases the likelihood of GI side effects. Desiree Cooper agrees to continue taking metformin 500 mg q AM #30 and we will refill for 1 month. Desiree Cooper agrees to follow up with our clinic in 3 to 4 weeks as directed to monitor her progress.  Diabetes risk counselling Desiree Cooper was given extended (15 minutes) diabetes prevention counseling today. She is 57 y.o. female and has risk factors for diabetes including obesity and pre-diabetes. We discussed intensive lifestyle modifications today with an emphasis on weight loss as well as increasing exercise and decreasing simple carbohydrates in her diet.  Obesity Desiree Cooper is currently in the action stage of change. As such, her goal is to continue with weight loss efforts She has agreed to follow the Category 2 plan Desiree Cooper has been instructed to work up to a goal of 150 minutes of combined cardio and strengthening exercise per week for weight loss and overall health benefits. We discussed the following Behavioral Modification Strategies today: work on meal planning and easy cooking  plans and holiday eating strategies    Desiree Cooper has agreed to follow up with our clinic in 3 to 4 weeks. She was informed of the importance of frequent follow up visits to maximize her success with intensive lifestyle modifications for her multiple health conditions.   OBESITY BEHAVIORAL INTERVENTION VISIT  Today's visit was # 5   Starting weight: 218 lbs Starting date: 10/09/17 Today's weight : 206 lbs  Today's date: 12/31/2017 Total lbs lost to date: 12    ASK: We discussed the diagnosis of obesity with Girard Cooter today and Desiree Cooper agreed to give [place] permission to discuss obesity behavioral modification therapy today.  ASSESS: Desiree Cooper has the diagnosis of obesity and her BMI today is 37.67 Desiree Cooper is in the action stage of change   ADVISE: Desiree Cooper was educated on the multiple health risks of obesity as well as the benefit of weight loss to improve her health. She was advised of the need for long term treatment and the importance of lifestyle modifications to improve her current health and to decrease her risk of future health problems.  AGREE: Multiple dietary modification options and treatment options were discussed and  Desiree Cooper agreed to follow the recommendations documented in the above note.  ARRANGE: Desiree Cooper was educated on the importance of frequent visits to treat obesity as outlined per CMS and USPSTF guidelines and agreed to schedule her next follow up appointment today.  I, Trixie Dredge, am acting as transcriptionist for Dennard Nip, MD  I have reviewed the above documentation for accuracy and completeness, and I agree with the above. -Dennard Nip, MD

## 2018-01-07 ENCOUNTER — Telehealth: Payer: Self-pay | Admitting: Family Medicine

## 2018-01-09 ENCOUNTER — Other Ambulatory Visit: Payer: Self-pay | Admitting: Family Medicine

## 2018-01-09 DIAGNOSIS — G2581 Restless legs syndrome: Secondary | ICD-10-CM

## 2018-01-17 ENCOUNTER — Other Ambulatory Visit (INDEPENDENT_AMBULATORY_CARE_PROVIDER_SITE_OTHER): Payer: Self-pay | Admitting: Family Medicine

## 2018-01-17 DIAGNOSIS — E559 Vitamin D deficiency, unspecified: Secondary | ICD-10-CM

## 2018-01-20 ENCOUNTER — Other Ambulatory Visit (INDEPENDENT_AMBULATORY_CARE_PROVIDER_SITE_OTHER): Payer: Self-pay | Admitting: Family Medicine

## 2018-01-20 DIAGNOSIS — E559 Vitamin D deficiency, unspecified: Secondary | ICD-10-CM

## 2018-01-26 ENCOUNTER — Encounter (INDEPENDENT_AMBULATORY_CARE_PROVIDER_SITE_OTHER): Payer: Self-pay

## 2018-01-26 ENCOUNTER — Ambulatory Visit (INDEPENDENT_AMBULATORY_CARE_PROVIDER_SITE_OTHER): Payer: Managed Care, Other (non HMO) | Admitting: Family Medicine

## 2018-02-04 ENCOUNTER — Encounter (INDEPENDENT_AMBULATORY_CARE_PROVIDER_SITE_OTHER): Payer: Self-pay

## 2018-02-24 ENCOUNTER — Other Ambulatory Visit: Payer: Self-pay | Admitting: Family Medicine

## 2018-03-06 ENCOUNTER — Other Ambulatory Visit (INDEPENDENT_AMBULATORY_CARE_PROVIDER_SITE_OTHER): Payer: Self-pay | Admitting: Family Medicine

## 2018-03-06 DIAGNOSIS — R7303 Prediabetes: Secondary | ICD-10-CM

## 2018-04-07 ENCOUNTER — Other Ambulatory Visit: Payer: Self-pay | Admitting: Family Medicine

## 2018-04-07 DIAGNOSIS — G2581 Restless legs syndrome: Secondary | ICD-10-CM

## 2018-04-08 NOTE — Telephone Encounter (Signed)
Last seen 07/11/17

## 2018-05-19 ENCOUNTER — Other Ambulatory Visit: Payer: Self-pay | Admitting: *Deleted

## 2018-05-19 ENCOUNTER — Ambulatory Visit (INDEPENDENT_AMBULATORY_CARE_PROVIDER_SITE_OTHER): Payer: Managed Care, Other (non HMO) | Admitting: Family Medicine

## 2018-05-19 ENCOUNTER — Encounter: Payer: Self-pay | Admitting: Family Medicine

## 2018-05-19 ENCOUNTER — Other Ambulatory Visit: Payer: Self-pay

## 2018-05-19 DIAGNOSIS — J0111 Acute recurrent frontal sinusitis: Secondary | ICD-10-CM

## 2018-05-19 MED ORDER — AZITHROMYCIN 250 MG PO TABS: ORAL_TABLET | ORAL | 0 refills | Status: DC

## 2018-05-19 MED ORDER — PREDNISONE 20 MG PO TABS: ORAL_TABLET | ORAL | 0 refills | Status: DC

## 2018-05-19 MED ORDER — BUDESONIDE-FORMOTEROL FUMARATE 160-4.5 MCG/ACT IN AERO: 2.0000 | INHALATION_SPRAY | Freq: Two times a day (BID) | RESPIRATORY_TRACT | 5 refills | Status: DC

## 2018-05-19 NOTE — Progress Notes (Signed)
Virtual Visit via telephone Note  I connected with Desiree Cooper on 05/19/18 at 1432 by telephone and verified that I am speaking with the correct person using two identifiers. Desiree Cooper is currently located at home and no other people are currently with her during visit. The provider, Fransisca Kaufmann [name], MD is located in their office at time of visit.  Call ended at 1440  I discussed the limitations, risks, security and privacy concerns of performing an evaluation and management service by telephone and the availability of in person appointments. I also discussed with the patient that there may be a patient responsible charge related to this service. The patient expressed understanding and agreed to proceed.   History and Present Illness: Patient complains of 1 week of sinus congestion and pressure and drainage.  Used OTC allergy and sinus medications.  No fevers, chills and body aches.  She has also been doing decongestants.  She denies any shortness of breath or chest tightness. She does not feel any relief from OTC medications.   No diagnosis found.  Outpatient Encounter Medications as of 05/19/2018  Medication Sig  . budesonide-formoterol (SYMBICORT) 160-4.5 MCG/ACT inhaler Inhale 2 puffs into the lungs 2 (two) times daily.  . diphenhydrAMINE (BENADRYL) 25 MG tablet Take 25 mg by mouth at bedtime as needed.  . fluticasone furoate-vilanterol (BREO ELLIPTA) 100-25 MCG/INH AEPB Inhale 1 puff into the lungs daily.  . metFORMIN (GLUCOPHAGE) 500 MG tablet Take 1 tablet (500 mg total) by mouth daily with breakfast.  . pramipexole (MIRAPEX) 0.125 MG tablet TAKE 1 TABLET (0.125 MG TOTAL) BY MOUTH 2 TO 3 HOURS BEFORE BEDTIME.  Marland Kitchen sertraline (ZOLOFT) 100 MG tablet TAKE 1 TABLET BY MOUTH EVERY DAY  . traZODone (DESYREL) 50 MG tablet TAKE 0.5-1 TABLETS (25-50 MG TOTAL) BY MOUTH AT BEDTIME AS NEEDED FOR SLEEP.  . Vitamin D, Ergocalciferol, (DRISDOL) 1.25 MG (50000 UT) CAPS capsule TAKE 1  CAPSULE (50,000 UNITS TOTAL) BY MOUTH EVERY 7 (SEVEN) DAYS.   No facility-administered encounter medications on file as of 05/19/2018.     Review of Systems  Constitutional: Negative for chills and fever.  HENT: Positive for congestion, postnasal drip, rhinorrhea, sinus pressure, sneezing and sore throat. Negative for ear discharge and ear pain.   Eyes: Negative for pain, redness and visual disturbance.  Respiratory: Positive for cough. Negative for chest tightness, shortness of breath and wheezing.   Cardiovascular: Negative for chest pain and leg swelling.  Genitourinary: Negative for difficulty urinating and dysuria.  Musculoskeletal: Negative for back pain, gait problem and myalgias.  Skin: Negative for rash.  Neurological: Negative for light-headedness and headaches.  Psychiatric/Behavioral: Negative for agitation and behavioral problems.  All other systems reviewed and are negative.   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   I discussed the assessment and  treatment plan with the patient. The patient was provided an opportunity to ask questions and all were answered. The patient agreed with the plan and demonstrated an understanding of the instructions.   The patient was advised to call back or seek an in-person evaluation if the symptoms worsen or if  the condition fails to improve as anticipated.  The above assessment and management plan was discussed with the patient. The patient verbalized understanding of and has agreed to the management plan. Patient is aware to call the clinic if symptoms persist or worsen. Patient is aware when to return to the clinic for a follow-up visit. Patient educated on when it is appropriate to go to the emergency department.    I provided 8 minutes of non-face-to-face time during this encounter.    Worthy Rancher, MD

## 2018-05-30 ENCOUNTER — Other Ambulatory Visit: Payer: Self-pay | Admitting: Family Medicine

## 2018-06-03 ENCOUNTER — Other Ambulatory Visit: Payer: Self-pay | Admitting: Family Medicine

## 2018-06-03 DIAGNOSIS — G2581 Restless legs syndrome: Secondary | ICD-10-CM

## 2018-06-05 ENCOUNTER — Telehealth: Payer: Self-pay | Admitting: Family Medicine

## 2018-06-05 ENCOUNTER — Other Ambulatory Visit: Payer: Self-pay | Admitting: Family Medicine

## 2018-06-05 DIAGNOSIS — G2581 Restless legs syndrome: Secondary | ICD-10-CM

## 2018-06-05 DIAGNOSIS — J0111 Acute recurrent frontal sinusitis: Secondary | ICD-10-CM

## 2018-06-05 MED ORDER — BUDESONIDE-FORMOTEROL FUMARATE 160-4.5 MCG/ACT IN AERO: 2.0000 | INHALATION_SPRAY | Freq: Two times a day (BID) | RESPIRATORY_TRACT | 1 refills | Status: DC

## 2018-06-05 MED ORDER — PRAMIPEXOLE DIHYDROCHLORIDE 0.125 MG PO TABS: ORAL_TABLET | ORAL | 0 refills | Status: DC

## 2018-06-05 MED ORDER — SERTRALINE HCL 100 MG PO TABS: 100.0000 mg | ORAL_TABLET | Freq: Every day | ORAL | 1 refills | Status: DC

## 2018-06-05 NOTE — Telephone Encounter (Signed)
Pt aware meds requested were sent to [facility].

## 2018-06-05 NOTE — Telephone Encounter (Signed)
Please advise on patient's request of 90 day supply due to losing insurance.

## 2018-06-05 NOTE — Telephone Encounter (Signed)
I am good with going ahead to send a 90-day supply of her medications for her, it looks like Symbicort and an antidepressant in 1 other medication but she did not stay in the phone message exactly which one she wanted sent.

## 2018-10-08 ENCOUNTER — Other Ambulatory Visit: Payer: Self-pay | Admitting: Family Medicine

## 2018-10-08 DIAGNOSIS — G2581 Restless legs syndrome: Secondary | ICD-10-CM

## 2018-11-02 ENCOUNTER — Other Ambulatory Visit: Payer: Self-pay | Admitting: Family Medicine

## 2018-11-02 DIAGNOSIS — G2581 Restless legs syndrome: Secondary | ICD-10-CM

## 2018-11-02 MED ORDER — PRAMIPEXOLE DIHYDROCHLORIDE 0.125 MG PO TABS: ORAL_TABLET | ORAL | 0 refills | Status: DC

## 2018-11-02 NOTE — Telephone Encounter (Signed)
Sent to [facility]

## 2018-11-05 ENCOUNTER — Other Ambulatory Visit: Payer: Self-pay

## 2018-11-05 ENCOUNTER — Other Ambulatory Visit: Payer: Self-pay | Admitting: Family Medicine

## 2018-11-05 DIAGNOSIS — G2581 Restless legs syndrome: Secondary | ICD-10-CM

## 2018-11-06 ENCOUNTER — Encounter: Payer: Self-pay | Admitting: Family Medicine

## 2018-11-06 ENCOUNTER — Ambulatory Visit (INDEPENDENT_AMBULATORY_CARE_PROVIDER_SITE_OTHER): Payer: Commercial Managed Care - PPO | Admitting: Family Medicine

## 2018-11-06 VITALS — BP 131/76 | HR 71 | Temp 99.1°F | Ht 62.0 in | Wt 232.0 lb

## 2018-11-06 DIAGNOSIS — F3341 Major depressive disorder, recurrent, in partial remission: Secondary | ICD-10-CM

## 2018-11-06 DIAGNOSIS — Z1159 Encounter for screening for other viral diseases: Secondary | ICD-10-CM

## 2018-11-06 DIAGNOSIS — G2581 Restless legs syndrome: Secondary | ICD-10-CM | POA: Diagnosis not present

## 2018-11-06 DIAGNOSIS — J4540 Moderate persistent asthma, uncomplicated: Secondary | ICD-10-CM

## 2018-11-06 DIAGNOSIS — R7303 Prediabetes: Secondary | ICD-10-CM

## 2018-11-06 DIAGNOSIS — I10 Essential (primary) hypertension: Secondary | ICD-10-CM | POA: Diagnosis not present

## 2018-11-06 LAB — BAYER DCA HB A1C WAIVED: HB A1C (BAYER DCA - WAIVED): 6.1 % (ref <7.0)

## 2018-11-06 MED ORDER — SERTRALINE HCL 100 MG PO TABS: 100.0000 mg | ORAL_TABLET | Freq: Every day | ORAL | 1 refills | Status: DC

## 2018-11-06 MED ORDER — PRAMIPEXOLE DIHYDROCHLORIDE 0.25 MG PO TABS: ORAL_TABLET | ORAL | 3 refills | Status: AC

## 2018-11-06 NOTE — Progress Notes (Signed)
BP 131/76   Pulse 71   Temp 99.1 F (37.3 C) (Oral)   Ht 5' 2"  (1.575 m)   Wt 232 lb (105.2 kg)   LMP 05/04/2010   SpO2 97%   BMI 42.43 kg/m    Subjective:   Patient ID: Desiree Cooper, female    DOB: [birth year], 58 y.o.   MRN: [id number]  HPI: Desiree Cooper is a 58 y.o. female presenting on 11/06/2018 for Medication Refill and Medical Management of Chronic Issues   HPI Anxiety depression recheck and restless legs Patient is coming in for anxiety and depression discuss restless legs.  She says restless legs been bothering her a lot recently and her anxiety has been up a little bit.  She is on Zoloft currently and lower dose of Mirapex and says that the Mirapex is not working as much. Depression screen [facility] 2/9 11/06/2018 10/09/2017 07/11/2017 11/14/2016 09/07/2016  Decreased Interest 0 3 0 1 0  Down, Depressed, Hopeless 0 3 0 2 0  PHQ - 2 Score 0 6 0 3 0  Altered sleeping 0 3 - 0 -  Tired, decreased energy 0 3 - 1 -  Change in appetite 0 3 - 1 -  Feeling bad or failure about yourself  0 3 - 0 -  Trouble concentrating 0 3 - 1 -  Moving slowly or fidgety/restless 0 2 - 0 -  Suicidal thoughts 0 0 - 0 -  PHQ-9 Score 0 23 - 6 -  Difficult doing work/chores - Extremely dIfficult - Not difficult at all -    Asthma Patient is coming in for COPD recheck today.  He is currently on Symbicort.  He has a mild chronic cough but denies any major coughing spells or wheezing spells.  He has 0nighttime symptoms per week and 0daytime symptoms per week currently.   Hypertension Patient is currently on no medication currently, and their blood pressure today is 131/76. Patient denies any lightheadedness or dizziness. Patient denies headaches, blurred vision, chest pains, shortness of breath, or weakness. Denies any side effects from medication and is content with current medication.   Prediabetes Patient comes in today for recheck of his diabetes. Patient has been currently taking 1 occasion  currently we are monitoring for now. Patient is not currently on an ACE inhibitor/ARB. Patient has not seen an ophthalmologist this year. Patient denies any issues with their feet.   Relevant past medical, surgical, family and social history reviewed and updated as indicated. Interim medical history since our last visit reviewed. Allergies and medications reviewed and updated.  Review of Systems  Constitutional: Negative for chills and fever.  Eyes: Negative for redness and visual disturbance.  Respiratory: Negative for chest tightness and shortness of breath.   Cardiovascular: Negative for chest pain and leg swelling.  Musculoskeletal: Negative for back pain and gait problem.  Skin: Negative for rash.  Neurological: Negative for light-headedness and headaches.  Psychiatric/Behavioral: Positive for sleep disturbance. Negative for agitation and behavioral problems.  All other systems reviewed and are negative.   Per HPI unless specifically indicated above   Allergies as of 11/06/2018      Reactions   Penicillins Rash      Medication List       Accurate as of November 06, 2018  4:43 PM. If you have any questions, ask your nurse or doctor.        STOP taking these medications   azithromycin 250 MG tablet Commonly known as: ZITHROMAX Stopped  by: Worthy Rancher, MD   fluticasone furoate-vilanterol 100-25 MCG/INH Aepb Commonly known as: Merchant [employer] officer Stopped by: Worthy Rancher, MD   metFORMIN 500 MG tablet Commonly known as: GLUCOPHAGE Stopped by: Worthy Rancher, MD   predniSONE 20 MG tablet Commonly known as: DELTASONE Stopped by: Worthy Rancher, MD   Vitamin D (Ergocalciferol) 1.25 MG (50000 UT) Caps capsule Commonly known as: DRISDOL Stopped by: Fransisca Kaufmann Haiden Clucas, MD     TAKE these medications   budesonide-formoterol 160-4.5 MCG/ACT inhaler Commonly known as: SYMBICORT Inhale 2 puffs into the lungs 2 (two) times daily. What changed:   when to  take this  reasons to take this   diphenhydrAMINE 25 MG tablet Commonly known as: BENADRYL Take 25 mg by mouth at bedtime as needed.   pramipexole 0.125 MG tablet Commonly known as: MIRAPEX TAKE 1 TABLET 2 TO 3 HOURS BEFORE BEDTIME.   sertraline 100 MG tablet Commonly known as: ZOLOFT Take 1 tablet (100 mg total) by mouth daily.        Objective:   BP 131/76   Pulse 71   Temp 99.1 F (37.3 C) (Oral)   Ht 5' 2"  (1.575 m)   Wt 232 lb (105.2 kg)   LMP 05/04/2010   SpO2 97%   BMI 42.43 kg/m   Wt Readings from Last 3 Encounters:  11/06/18 232 lb (105.2 kg)  12/31/17 206 lb (93.4 kg)  12/11/17 210 lb (95.3 kg)    Physical Exam Vitals signs and nursing note reviewed.  Constitutional:      General: She is not in acute distress.    Appearance: She is well-developed. She is not diaphoretic.  Eyes:     Conjunctiva/sclera: Conjunctivae normal.  Cardiovascular:     Rate and Rhythm: Normal rate and regular rhythm.     Heart sounds: Normal heart sounds. No murmur.  Pulmonary:     Effort: Pulmonary effort is normal. No respiratory distress.     Breath sounds: Normal breath sounds. No wheezing.  Musculoskeletal: Normal range of motion.        General: No tenderness.  Skin:    General: Skin is warm and dry.     Findings: No rash.  Neurological:     Mental Status: She is alert and oriented to person, place, and time.     Coordination: Coordination normal.  Psychiatric:        Mood and Affect: Mood is anxious and depressed.        Behavior: Behavior normal.        Thought Content: Thought content does not include suicidal ideation. Thought content does not include suicidal plan.     Assessment & Plan:   Problem List Items Addressed This Visit      Cardiovascular and Mediastinum   Hypertensive disorder   Relevant Orders   CMP14+EGFR (Completed)     Respiratory   Asthma, chronic   Relevant Orders   CBC with Differential/Platelet (Completed)     Other    Depression   Relevant Medications   sertraline (ZOLOFT) 100 MG tablet   Other Relevant Orders   CBC with Differential/Platelet (Completed)   Pre-diabetes - Primary   Relevant Orders   Bayer DCA Hb A1c Waived (Completed)   Lipid panel (Completed)   Restless leg syndrome   Relevant Medications   pramipexole (MIRAPEX) 0.25 MG tablet    Other Visit Diagnoses    Need for hepatitis C screening test       Relevant  Orders   Hepatitis C antibody (Completed)      Continue Symbicort for asthma and will give Mirapex for restless leg  Increase Zoloft from 50 to 100 mg as well  Continue Lipitor Follow up plan: Return in about 6 months (around 05/06/2019), or if symptoms worsen or fail to improve, for Recheck anxiety depression and prediabetes.  Counseling provided for all of the vaccine components Orders Placed This Encounter  Procedures  . [facility] Hb A1c [place], MD [facility] 11/06/2018, 4:43 PM

## 2018-11-07 LAB — CMP14+EGFR
ALT: 38 IU/L — ABNORMAL HIGH (ref 0–32)
AST: 29 IU/L (ref 0–40)
Albumin/Globulin Ratio: 1.9 (ref 1.2–2.2)
Albumin: 4.7 g/dL (ref 3.8–4.9)
Alkaline Phosphatase: 75 IU/L (ref 39–117)
BUN/Creatinine Ratio: 13 (ref 9–23)
BUN: 10 mg/dL (ref 6–24)
Bilirubin Total: <0.2 mg/dL (ref 0.0–1.2)
CO2: 21 mmol/L (ref 20–29)
Calcium: 10 mg/dL (ref 8.7–10.2)
Chloride: 106 mmol/L (ref 96–106)
Creatinine, Ser: 0.75 mg/dL (ref 0.57–1.00)
GFR calc Af Amer: 101 mL/min/{1.73_m2} (ref >59)
GFR calc non Af Amer: 88 mL/min/{1.73_m2} (ref >59)
Globulin, Total: 2.5 g/dL (ref 1.5–4.5)
Glucose: 103 mg/dL — ABNORMAL HIGH (ref 65–99)
Potassium: 4.9 mmol/L (ref 3.5–5.2)
Sodium: 144 mmol/L (ref 134–144)
Total Protein: 7.2 g/dL (ref 6.0–8.5)

## 2018-11-07 LAB — CBC WITH DIFFERENTIAL/PLATELET
Basophils Absolute: 0.1 10*3/uL (ref 0.0–0.2)
Basos: 1 %
EOS (ABSOLUTE): 0.5 10*3/uL — ABNORMAL HIGH (ref 0.0–0.4)
Eos: 5 %
Hematocrit: 43.4 % (ref 34.0–46.6)
Hemoglobin: 14.7 g/dL (ref 11.1–15.9)
Immature Grans (Abs): 0 10*3/uL (ref 0.0–0.1)
Immature Granulocytes: 0 %
Lymphocytes Absolute: 2.7 10*3/uL (ref 0.7–3.1)
Lymphs: 24 %
MCH: 28.9 pg (ref 26.6–33.0)
MCHC: 33.9 g/dL (ref 31.5–35.7)
MCV: 85 fL (ref 79–97)
Monocytes Absolute: 1 10*3/uL — ABNORMAL HIGH (ref 0.1–0.9)
Monocytes: 8 %
Neutrophils Absolute: 7 10*3/uL (ref 1.4–7.0)
Neutrophils: 62 %
Platelets: 300 10*3/uL (ref 150–450)
RBC: 5.09 x10E6/uL (ref 3.77–5.28)
RDW: 12.3 % (ref 11.7–15.4)
WBC: 11.4 10*3/uL — ABNORMAL HIGH (ref 3.4–10.8)

## 2018-11-07 LAB — LIPID PANEL
Chol/HDL Ratio: 5.1 ratio — ABNORMAL HIGH (ref 0.0–4.4)
Cholesterol, Total: 219 mg/dL — ABNORMAL HIGH (ref 100–199)
HDL: 43 mg/dL (ref >39)
LDL Chol Calc (NIH): 139 mg/dL — ABNORMAL HIGH (ref 0–99)
Triglycerides: 205 mg/dL — ABNORMAL HIGH (ref 0–149)
VLDL Cholesterol Cal: 37 mg/dL (ref 5–40)

## 2018-11-07 LAB — HEPATITIS C ANTIBODY: Hep C Virus Ab: <0.1 s/co ratio (ref 0.0–0.9)

## 2018-11-09 ENCOUNTER — Other Ambulatory Visit: Payer: Self-pay | Admitting: *Deleted

## 2018-11-09 MED ORDER — ATORVASTATIN CALCIUM 20 MG PO TABS: 20.0000 mg | ORAL_TABLET | Freq: Every day | ORAL | 3 refills | Status: DC

## 2019-04-21 ENCOUNTER — Other Ambulatory Visit: Payer: Self-pay | Admitting: Family Medicine

## 2019-04-21 DIAGNOSIS — F3341 Major depressive disorder, recurrent, in partial remission: Secondary | ICD-10-CM

## 2019-04-21 DIAGNOSIS — J0111 Acute recurrent frontal sinusitis: Secondary | ICD-10-CM

## 2019-10-14 ENCOUNTER — Other Ambulatory Visit: Payer: Self-pay | Admitting: Family Medicine

## 2019-10-14 DIAGNOSIS — F3341 Major depressive disorder, recurrent, in partial remission: Secondary | ICD-10-CM

## 2019-10-14 DIAGNOSIS — J0111 Acute recurrent frontal sinusitis: Secondary | ICD-10-CM

## 2019-11-17 ENCOUNTER — Other Ambulatory Visit: Payer: Self-pay | Admitting: Family Medicine

## 2019-11-17 DIAGNOSIS — F3341 Major depressive disorder, recurrent, in partial remission: Secondary | ICD-10-CM

## 2019-11-17 DIAGNOSIS — G2581 Restless legs syndrome: Secondary | ICD-10-CM

## 2019-11-17 DIAGNOSIS — J0111 Acute recurrent frontal sinusitis: Secondary | ICD-10-CM

## 2019-11-17 NOTE — Telephone Encounter (Signed)
[name]. NTBS 30 days given 10/15/19 LOV 11/06/18

## 2023-11-04 ENCOUNTER — Encounter: Payer: Self-pay | Admitting: Internal Medicine

## 2024-02-24 ENCOUNTER — Telehealth: Payer: Self-pay

## 2024-02-24 NOTE — Telephone Encounter (Signed)
 Attempted to reach patient concerning colonoscopy recall; unable to speak with patient; unable to leave a voicemail as mailbox is full; will attempt to reach patient at a later date/time;
